# Patient Record
Sex: Male | Born: 1940 | Race: White | Hispanic: No | Marital: Married | Smoking: Former smoker
Health system: Southern US, Community
[De-identification: ages and names within clinical notes are randomized; demographics above are authoritative.]

## PROBLEM LIST (undated history)

## (undated) DIAGNOSIS — Z87442 Personal history of urinary calculi: Secondary | ICD-10-CM

## (undated) DIAGNOSIS — K219 Gastro-esophageal reflux disease without esophagitis: Secondary | ICD-10-CM

## (undated) DIAGNOSIS — M1990 Unspecified osteoarthritis, unspecified site: Secondary | ICD-10-CM

## (undated) DIAGNOSIS — J189 Pneumonia, unspecified organism: Secondary | ICD-10-CM

## (undated) DIAGNOSIS — Z8719 Personal history of other diseases of the digestive system: Secondary | ICD-10-CM

## (undated) DIAGNOSIS — I499 Cardiac arrhythmia, unspecified: Secondary | ICD-10-CM

## (undated) DIAGNOSIS — I10 Essential (primary) hypertension: Secondary | ICD-10-CM

## (undated) HISTORY — PX: KNEE ARTHROPLASTY: SHX992

## (undated) HISTORY — PX: COLONOSCOPY: SHX174

## (undated) HISTORY — PX: TONSILLECTOMY: SUR1361

---

## 2001-12-08 HISTORY — PX: FRACTURE SURGERY: SHX138

## 2017-10-26 ENCOUNTER — Other Ambulatory Visit: Payer: Self-pay | Admitting: Orthopedic Surgery

## 2017-10-26 DIAGNOSIS — M545 Low back pain: Secondary | ICD-10-CM

## 2017-11-10 ENCOUNTER — Ambulatory Visit
Admission: RE | Admit: 2017-11-10 | Discharge: 2017-11-10 | Disposition: A | Discharge status: Home / Self Care | Payer: Medicare Other | Source: Ambulatory Visit | Attending: Orthopedic Surgery | Admitting: Orthopedic Surgery

## 2017-11-10 DIAGNOSIS — M545 Low back pain: Secondary | ICD-10-CM

## 2017-11-10 MED ORDER — ONDANSETRON HCL 4 MG/2ML IJ SOLN
4.0000 mg | Freq: Once | INTRAMUSCULAR | Status: AC
  Administered 2017-11-10: 4 mg via INTRAMUSCULAR

## 2017-11-10 MED ORDER — DIAZEPAM 5 MG PO TABS
5.0000 mg | ORAL_TABLET | Freq: Once | ORAL | Status: AC
  Administered 2017-11-10: 5 mg via ORAL

## 2017-11-10 MED ORDER — IOPAMIDOL (ISOVUE-M 200) INJECTION 41%
15.0000 mL | Freq: Once | INTRAMUSCULAR | Status: AC
  Administered 2017-11-10: 15 mL via INTRATHECAL

## 2017-11-10 MED ORDER — MEPERIDINE HCL 100 MG/ML IJ SOLN
75.0000 mg | Freq: Once | INTRAMUSCULAR | Status: AC
  Administered 2017-11-10: 75 mg via INTRAMUSCULAR

## 2017-11-10 NOTE — Progress Notes (Signed)
Pt states he has not take Eliquis since Saturday.

## 2017-11-10 NOTE — Discharge Instructions (Signed)

## 2017-12-03 NOTE — Progress Notes (Signed)
11-23-17 Cardiac clearance on chart  07-01-17 EKG on chart  04-21-16 ECHO on chart

## 2017-12-03 NOTE — Patient Instructions (Addendum)
Gene Lopez  12/03/2017   Your procedure is scheduled on: 12-16-16   [facility]  Report to Admitting at 6:00 AM   Call this number if you have problems the morning of surgery [phone]   Remember: ONLY 1 PERSON MAY GO WITH YOU TO SHORT STAY TO GET  READY MORNING OF YOUR SURGERY.  Do not eat food or drink liquids :After Midnight.     Take these medicines the morning of surgery with A SIP OF WATER: Metoprolol Succinate (Toprol-XL), Montelukast (Singulair),Cetirizine (Zyrtec)  and Tikosyn (Dofetilide)                                You may not have any metal on your body including hair pins and              piercings  Do not wear jewelry, lotions, powders or deodorant             Men may shave face and neck.   Do not bring valuables to the hospital. [facility] IS NOT             RESPONSIBLE   FOR VALUABLES.  Contacts, dentures or bridgework may not be worn into surgery.  Leave suitcase in the car. After surgery it may be brought to your room.               Please read over the following fact sheets you were given: _____________________________________________________________________          [facility] - Preparing for [facility] Before surgery, you can play an important role.  Because skin is not sterile, your skin needs to be as free of germs as possible.  You can reduce the number of germs on your skin by washing with CHG (chlorahexidine gluconate) soap before surgery.  CHG is an antiseptic cleaner which kills germs and bonds with the skin to continue killing germs even after washing. Please DO NOT use if you have an allergy to CHG or antibacterial soaps.  If your skin becomes reddened/irritated stop using the CHG and inform your nurse when you arrive at Short Stay. Do not shave (including legs and underarms) for at least 48 hours prior to the first CHG shower.  You may shave your face/neck. Please follow these instructions  carefully:  1.  Shower with CHG Soap the night before surgery and the  morning of Surgery.  2.  If you choose to wash your hair, wash your hair first as usual with your  normal  shampoo.  3.  After you shampoo, rinse your hair and body thoroughly to remove the  shampoo.                           4.  Use CHG as you would any other liquid soap.  You can apply chg directly  to the skin and wash                       Gently with a scrungie or clean washcloth.  5.  Apply the CHG Soap to your body ONLY FROM THE NECK DOWN.   Do not use on face/ open  Wound or open sores. Avoid contact with eyes, ears mouth and genitals (private parts).                       Wash face,  Genitals (private parts) with your normal soap.             6.  Wash thoroughly, paying special attention to the area where your surgery  will be performed.  7.  Thoroughly rinse your body with warm water from the neck down.  8.  DO NOT shower/wash with your normal soap after using and rinsing off  the CHG Soap.                9.  Pat yourself dry with a clean towel.            10.  Wear clean pajamas.            11.  Place clean sheets on your bed the night of your first shower and do not  sleep with pets. Day of Surgery : Do not apply any lotions/deodorants the morning of surgery.  Please wear clean clothes to the hospital/surgery center.  FAILURE TO FOLLOW THESE INSTRUCTIONS MAY RESULT IN THE CANCELLATION OF YOUR SURGERY PATIENT SIGNATURE_________________________________  NURSE SIGNATURE__________________________________  ________________________________________________________________________   Gene Lopez  An incentive spirometer is a tool that can help keep your lungs clear and active. This tool measures how well you are filling your lungs with each breath. Taking long deep breaths may help reverse or decrease the chance of developing breathing (pulmonary) problems (especially infection)  following:  A long period of time when you are unable to move or be active. BEFORE THE PROCEDURE   If the spirometer includes an indicator to show your best effort, your nurse or respiratory therapist will set it to a desired goal.  If possible, sit up straight or lean slightly forward. Try not to slouch.  Hold the incentive spirometer in an upright position. INSTRUCTIONS FOR USE  1. Sit on the edge of your bed if possible, or sit up as far as you can in bed or on a chair. 2. Hold the incentive spirometer in an upright position. 3. Breathe out normally. 4. Place the mouthpiece in your mouth and seal your lips tightly around it. 5. Breathe in slowly and as deeply as possible, raising the piston or the ball toward the top of the column. 6. Hold your breath for 3-5 seconds or for as long as possible. Allow the piston or ball to fall to the bottom of the column. 7. Remove the mouthpiece from your mouth and breathe out normally. 8. Rest for a few seconds and repeat Steps 1 through 7 at least 10 times every 1-2 hours when you are awake. Take your time and take a few normal breaths between deep breaths. 9. The spirometer may include an indicator to show your best effort. Use the indicator as a goal to work toward during each repetition. 10. After each set of 10 deep breaths, practice coughing to be sure your lungs are clear. If you have an incision (the cut made at the time of surgery), support your incision when coughing by placing a pillow or rolled up towels firmly against it. Once you are able to get out of bed, walk around indoors and cough well. You may stop using the incentive spirometer when instructed by your caregiver.  RISKS AND COMPLICATIONS  Take your time so you do not get  dizzy or light-headed.  If you are in pain, you may need to take or ask for pain medication before doing incentive spirometry. It is harder to take a deep breath if you are having pain. AFTER USE  Rest and  breathe slowly and easily.  It can be helpful to keep track of a log of your progress. Your caregiver can provide you with a simple table to help with this. If you are using the spirometer at home, follow these instructions: [place] IF:   You are having difficultly using the spirometer.  You have trouble using the spirometer as often as instructed.  Your pain medication is not giving enough relief while using the spirometer.  You develop fever of 100.5 F (38.1 C) or higher. SEEK IMMEDIATE MEDICAL CARE IF:   You cough up bloody sputum that had not been present before.  You develop fever of 102 F (38.9 C) or greater.  You develop worsening pain at or near the incision site. MAKE SURE YOU:   Understand these instructions.  Will watch your condition.  Will get help right away if you are not doing well or get worse. Document Released: 04/06/2007 Document Revised: 02/16/2012 Document Reviewed: 06/07/2007 [facility] Patient Information 2014 [place], [place].   ________________________________________________________________________

## 2017-12-04 ENCOUNTER — Ambulatory Visit (HOSPITAL_COMMUNITY)
Admission: RE | Admit: 2017-12-04 | Discharge: 2017-12-04 | Disposition: A | Discharge status: Home / Self Care | Payer: Medicare Other | Source: Ambulatory Visit | Attending: Surgical | Admitting: Surgical

## 2017-12-04 ENCOUNTER — Encounter (HOSPITAL_COMMUNITY): Payer: Self-pay

## 2017-12-04 ENCOUNTER — Encounter (HOSPITAL_COMMUNITY)
Admission: RE | Admit: 2017-12-04 | Discharge: 2017-12-04 | Disposition: A | Discharge status: Home / Self Care | Payer: Medicare Other | Source: Ambulatory Visit | Attending: Orthopedic Surgery | Admitting: Orthopedic Surgery

## 2017-12-04 ENCOUNTER — Other Ambulatory Visit: Payer: Self-pay

## 2017-12-04 DIAGNOSIS — R918 Other nonspecific abnormal finding of lung field: Secondary | ICD-10-CM | POA: Diagnosis not present

## 2017-12-04 DIAGNOSIS — Z01818 Encounter for other preprocedural examination: Secondary | ICD-10-CM | POA: Diagnosis present

## 2017-12-04 DIAGNOSIS — I517 Cardiomegaly: Secondary | ICD-10-CM | POA: Diagnosis not present

## 2017-12-04 DIAGNOSIS — J9811 Atelectasis: Secondary | ICD-10-CM | POA: Diagnosis not present

## 2017-12-04 DIAGNOSIS — M419 Scoliosis, unspecified: Secondary | ICD-10-CM | POA: Insufficient documentation

## 2017-12-04 DIAGNOSIS — M5136 Other intervertebral disc degeneration, lumbar region: Secondary | ICD-10-CM | POA: Insufficient documentation

## 2017-12-04 DIAGNOSIS — Z01812 Encounter for preprocedural laboratory examination: Secondary | ICD-10-CM | POA: Insufficient documentation

## 2017-12-04 DIAGNOSIS — M545 Low back pain, unspecified: Secondary | ICD-10-CM

## 2017-12-04 DIAGNOSIS — M48061 Spinal stenosis, lumbar region without neurogenic claudication: Secondary | ICD-10-CM | POA: Diagnosis not present

## 2017-12-04 HISTORY — DX: Cardiac arrhythmia, unspecified: I49.9

## 2017-12-04 HISTORY — DX: Personal history of other diseases of the digestive system: Z87.19

## 2017-12-04 HISTORY — DX: Pneumonia, unspecified organism: J18.9

## 2017-12-04 HISTORY — DX: Unspecified osteoarthritis, unspecified site: M19.90

## 2017-12-04 HISTORY — DX: Personal history of urinary calculi: Z87.442

## 2017-12-04 HISTORY — DX: Gastro-esophageal reflux disease without esophagitis: K21.9

## 2017-12-04 HISTORY — DX: Essential (primary) hypertension: I10

## 2017-12-04 LAB — CBC WITH DIFFERENTIAL/PLATELET
Basophils Absolute: 0 10*3/uL (ref 0.0–0.1)
Basophils Relative: 0 %
Eosinophils Absolute: 0.1 10*3/uL (ref 0.0–0.7)
Eosinophils Relative: 2 %
HCT: 36.7 % — ABNORMAL LOW (ref 39.0–52.0)
Hemoglobin: 12.7 g/dL — ABNORMAL LOW (ref 13.0–17.0)
Lymphocytes Relative: 18 %
Lymphs Abs: 1.2 10*3/uL (ref 0.7–4.0)
MCH: 35.4 pg — ABNORMAL HIGH (ref 26.0–34.0)
MCHC: 34.6 g/dL (ref 30.0–36.0)
MCV: 102.2 fL — ABNORMAL HIGH (ref 78.0–100.0)
Monocytes Absolute: 0.6 10*3/uL (ref 0.1–1.0)
Monocytes Relative: 9 %
Neutro Abs: 4.8 10*3/uL (ref 1.7–7.7)
Neutrophils Relative %: 71 %
Platelets: 126 10*3/uL — ABNORMAL LOW (ref 150–400)
RBC: 3.59 MIL/uL — ABNORMAL LOW (ref 4.22–5.81)
RDW: 12.7 % (ref 11.5–15.5)
WBC: 6.7 10*3/uL (ref 4.0–10.5)

## 2017-12-04 LAB — PROTIME-INR
INR: 1.31
Prothrombin Time: 16.2 seconds — ABNORMAL HIGH (ref 11.4–15.2)

## 2017-12-04 LAB — SURGICAL PCR SCREEN
MRSA, PCR: NEGATIVE
STAPHYLOCOCCUS AUREUS: NEGATIVE

## 2017-12-04 LAB — APTT: aPTT: 41 seconds — ABNORMAL HIGH (ref 24–36)

## 2017-12-04 LAB — COMPREHENSIVE METABOLIC PANEL
ALT: 25 U/L (ref 17–63)
AST: 19 U/L (ref 15–41)
Albumin: 3.9 g/dL (ref 3.5–5.0)
Alkaline Phosphatase: 69 U/L (ref 38–126)
Anion gap: 4 — ABNORMAL LOW (ref 5–15)
BUN: 26 mg/dL — ABNORMAL HIGH (ref 6–20)
CO2: 24 mmol/L (ref 22–32)
Calcium: 8.8 mg/dL — ABNORMAL LOW (ref 8.9–10.3)
Chloride: 111 mmol/L (ref 101–111)
Creatinine, Ser: 1.18 mg/dL (ref 0.61–1.24)
GFR calc Af Amer: >60 mL/min (ref >60)
GFR calc non Af Amer: 59 mL/min — ABNORMAL LOW (ref >60)
Glucose, Bld: 92 mg/dL (ref 65–99)
Potassium: 4.8 mmol/L (ref 3.5–5.1)
Sodium: 139 mmol/L (ref 135–145)
Total Bilirubin: 1.1 mg/dL (ref 0.3–1.2)
Total Protein: 7 g/dL (ref 6.5–8.1)

## 2017-12-04 NOTE — Progress Notes (Signed)
12-04-17 CBC w/Diff, CMP, PT, PTT routed to Dr. Darrelyn HillockGioffre for review

## 2017-12-15 NOTE — Anesthesia Preprocedure Evaluation (Addendum)
Anesthesia Evaluation  Patient identified by MRN, date of birth, ID band Patient awake    Reviewed: Allergy & Precautions, NPO status , Patient's Chart, lab work & pertinent test results  Airway Mallampati: II  TM Distance: >3 FB Neck ROM: Full    Dental  (+) Dental Advisory Given   Pulmonary former smoker,    breath sounds clear to auscultation       Cardiovascular hypertension, Pt. on medications and Pt. on home beta blockers + dysrhythmias Atrial Fibrillation  Rhythm:Regular Rate:Normal  2017 Echo: Nl EF. Valves okay. 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   Neuro/Psych Spinal stenosis    GI/Hepatic Neg liver ROS, hiatal hernia, GERD  ,  Endo/Other  negative endocrine ROS  Renal/GU negative Renal ROS     Musculoskeletal  (+) Arthritis ,   Abdominal   Peds  Hematology  (+) anemia ,   Anesthesia Other Findings   Reproductive/Obstetrics                            Lab Results  Component Value Date   WBC 6.7 12/04/2017   HGB 12.7 (L) 12/04/2017   HCT 36.7 (L) 12/04/2017   MCV 102.2 (H) 12/04/2017   PLT 126 (L) 12/04/2017   Lab Results  Component Value Date   CREATININE 1.18 12/04/2017   BUN 26 (H) 12/04/2017   NA 139 12/04/2017   K 4.8 12/04/2017   CL 111 12/04/2017   CO2 24 12/04/2017    Anesthesia Physical Anesthesia Plan  ASA: III  Anesthesia Plan: General   Post-op Pain Management:    Induction: Intravenous  PONV Risk Score and Plan: 2 and Dexamethasone, Ondansetron and Treatment may vary due to age or medical condition  Airway Management Planned: Oral ETT  Additional Equipment:   Intra-op Plan:   Post-operative Plan: Extubation in OR  Informed Consent: I have reviewed the patients History and Physical, chart, labs and discussed the procedure including the risks, benefits and alternatives for the proposed anesthesia with the patient or authorized representative who has indicated  his/her understanding and acceptance.   Dental advisory given  Plan Discussed with: CRNA  Anesthesia Plan Comments:        Anesthesia Quick Evaluation

## 2017-12-16 ENCOUNTER — Encounter (HOSPITAL_COMMUNITY)
Admission: RE | Disposition: A | Discharge status: Home / Self Care | Payer: Self-pay | Source: Ambulatory Visit | Attending: Orthopedic Surgery

## 2017-12-16 ENCOUNTER — Ambulatory Visit (HOSPITAL_COMMUNITY): Payer: Medicare Other | Admitting: Anesthesiology

## 2017-12-16 ENCOUNTER — Ambulatory Visit (HOSPITAL_COMMUNITY): Payer: Medicare Other

## 2017-12-16 ENCOUNTER — Encounter (HOSPITAL_COMMUNITY): Payer: Self-pay | Admitting: *Deleted

## 2017-12-16 ENCOUNTER — Observation Stay (HOSPITAL_COMMUNITY)
Admission: RE | Admit: 2017-12-16 | Discharge: 2017-12-17 | Disposition: A | Discharge status: Home / Self Care | Payer: Medicare Other | Source: Ambulatory Visit | Attending: Orthopedic Surgery | Admitting: Orthopedic Surgery

## 2017-12-16 ENCOUNTER — Other Ambulatory Visit: Payer: Self-pay

## 2017-12-16 DIAGNOSIS — M21371 Foot drop, right foot: Secondary | ICD-10-CM | POA: Insufficient documentation

## 2017-12-16 DIAGNOSIS — M48062 Spinal stenosis, lumbar region with neurogenic claudication: Secondary | ICD-10-CM | POA: Diagnosis not present

## 2017-12-16 DIAGNOSIS — Z79899 Other long term (current) drug therapy: Secondary | ICD-10-CM | POA: Insufficient documentation

## 2017-12-16 DIAGNOSIS — Z7901 Long term (current) use of anticoagulants: Secondary | ICD-10-CM | POA: Insufficient documentation

## 2017-12-16 DIAGNOSIS — Z87891 Personal history of nicotine dependence: Secondary | ICD-10-CM | POA: Insufficient documentation

## 2017-12-16 DIAGNOSIS — I4891 Unspecified atrial fibrillation: Secondary | ICD-10-CM | POA: Insufficient documentation

## 2017-12-16 DIAGNOSIS — M5126 Other intervertebral disc displacement, lumbar region: Secondary | ICD-10-CM | POA: Diagnosis not present

## 2017-12-16 DIAGNOSIS — M549 Dorsalgia, unspecified: Secondary | ICD-10-CM | POA: Diagnosis present

## 2017-12-16 DIAGNOSIS — Z419 Encounter for procedure for purposes other than remedying health state, unspecified: Secondary | ICD-10-CM

## 2017-12-16 DIAGNOSIS — I10 Essential (primary) hypertension: Secondary | ICD-10-CM | POA: Diagnosis not present

## 2017-12-16 DIAGNOSIS — Z96653 Presence of artificial knee joint, bilateral: Secondary | ICD-10-CM | POA: Insufficient documentation

## 2017-12-16 HISTORY — PX: LUMBAR LAMINECTOMY/DECOMPRESSION MICRODISCECTOMY: SHX5026

## 2017-12-16 LAB — HEMOGLOBIN AND HEMATOCRIT, BLOOD
HCT: 29.9 % — ABNORMAL LOW (ref 39.0–52.0)
Hemoglobin: 10.5 g/dL — ABNORMAL LOW (ref 13.0–17.0)

## 2017-12-16 SURGERY — LUMBAR LAMINECTOMY/DECOMPRESSION MICRODISCECTOMY
Anesthesia: General | Site: Back

## 2017-12-16 MED ORDER — HYDROCODONE-ACETAMINOPHEN 10-325 MG PO TABS
2.0000 | ORAL_TABLET | ORAL | Status: DC | PRN
  Administered 2017-12-16 – 2017-12-17 (×3): 2 via ORAL
  Filled 2017-12-16 (×3): qty 2

## 2017-12-16 MED ORDER — BUPIVACAINE LIPOSOME 1.3 % IJ SUSP
INTRAMUSCULAR | Status: DC | PRN
  Administered 2017-12-16: 20 mL

## 2017-12-16 MED ORDER — FENTANYL CITRATE (PF) 100 MCG/2ML IJ SOLN
INTRAMUSCULAR | Status: DC | PRN
  Administered 2017-12-16 (×3): 50 ug via INTRAVENOUS
  Administered 2017-12-16: 100 ug via INTRAVENOUS
  Administered 2017-12-16: 50 ug via INTRAVENOUS

## 2017-12-16 MED ORDER — ACETAMINOPHEN 650 MG RE SUPP: 650.0000 mg | RECTAL | Status: DC | PRN

## 2017-12-16 MED ORDER — PHENYLEPHRINE 40 MCG/ML (10ML) SYRINGE FOR IV PUSH (FOR BLOOD PRESSURE SUPPORT)
PREFILLED_SYRINGE | INTRAVENOUS | Status: AC
  Filled 2017-12-16: qty 10

## 2017-12-16 MED ORDER — BUPIVACAINE LIPOSOME 1.3 % IJ SUSP
20.0000 mL | Freq: Once | INTRAMUSCULAR | Status: DC
  Filled 2017-12-16: qty 20

## 2017-12-16 MED ORDER — ROCURONIUM BROMIDE 50 MG/5ML IV SOSY
PREFILLED_SYRINGE | INTRAVENOUS | Status: AC
  Filled 2017-12-16: qty 5

## 2017-12-16 MED ORDER — CHLORHEXIDINE GLUCONATE 4 % EX LIQD: 60.0000 mL | Freq: Once | CUTANEOUS | Status: DC

## 2017-12-16 MED ORDER — FENTANYL CITRATE (PF) 250 MCG/5ML IJ SOLN
INTRAMUSCULAR | Status: AC
  Filled 2017-12-16: qty 5

## 2017-12-16 MED ORDER — LIDOCAINE HCL (CARDIAC) 20 MG/ML IV SOLN
INTRAVENOUS | Status: DC | PRN
  Administered 2017-12-16: 75 mg via INTRAVENOUS
  Administered 2017-12-16: 25 mg via INTRATRACHEAL

## 2017-12-16 MED ORDER — PHENOL 1.4 % MT LIQD: 1.0000 | OROMUCOSAL | Status: DC | PRN

## 2017-12-16 MED ORDER — FENTANYL CITRATE (PF) 100 MCG/2ML IJ SOLN
INTRAMUSCULAR | Status: AC
  Filled 2017-12-16: qty 2

## 2017-12-16 MED ORDER — BACITRACIN-NEOMYCIN-POLYMYXIN 400-5-5000 EX OINT
TOPICAL_OINTMENT | CUTANEOUS | Status: DC | PRN
  Administered 2017-12-16: 1 via TOPICAL

## 2017-12-16 MED ORDER — LIDOCAINE-EPINEPHRINE (PF) 1 %-1:200000 IJ SOLN
INTRAMUSCULAR | Status: AC
  Filled 2017-12-16: qty 30

## 2017-12-16 MED ORDER — ONDANSETRON HCL 4 MG/2ML IJ SOLN: 4.0000 mg | Freq: Four times a day (QID) | INTRAMUSCULAR | Status: DC | PRN

## 2017-12-16 MED ORDER — SUGAMMADEX SODIUM 200 MG/2ML IV SOLN
INTRAVENOUS | Status: AC
  Filled 2017-12-16: qty 2

## 2017-12-16 MED ORDER — FLEET ENEMA 7-19 GM/118ML RE ENEM: 1.0000 | ENEMA | Freq: Once | RECTAL | Status: DC | PRN

## 2017-12-16 MED ORDER — PHENYLEPHRINE HCL 10 MG/ML IJ SOLN
INTRAMUSCULAR | Status: AC
  Filled 2017-12-16: qty 1

## 2017-12-16 MED ORDER — LIDOCAINE 2% (20 MG/ML) 5 ML SYRINGE
INTRAMUSCULAR | Status: AC
  Filled 2017-12-16: qty 5

## 2017-12-16 MED ORDER — HYDROMORPHONE HCL 1 MG/ML IJ SOLN
INTRAMUSCULAR | Status: AC
  Filled 2017-12-16: qty 1

## 2017-12-16 MED ORDER — LACTATED RINGERS IV SOLN
INTRAVENOUS | Status: DC
  Administered 2017-12-16: 100 mL/h via INTRAVENOUS

## 2017-12-16 MED ORDER — POLYETHYLENE GLYCOL 3350 17 G PO PACK: 17.0000 g | PACK | Freq: Every day | ORAL | Status: DC | PRN

## 2017-12-16 MED ORDER — ONDANSETRON HCL 4 MG PO TABS: 4.0000 mg | ORAL_TABLET | Freq: Four times a day (QID) | ORAL | Status: DC | PRN

## 2017-12-16 MED ORDER — SUCCINYLCHOLINE CHLORIDE 200 MG/10ML IV SOSY
PREFILLED_SYRINGE | INTRAVENOUS | Status: AC
  Filled 2017-12-16: qty 10

## 2017-12-16 MED ORDER — LISINOPRIL 10 MG PO TABS
10.0000 mg | ORAL_TABLET | Freq: Every day | ORAL | Status: DC
  Administered 2017-12-17: 10 mg via ORAL
  Filled 2017-12-16: qty 1

## 2017-12-16 MED ORDER — THROMBIN 5000 UNITS EX SOLR
CUTANEOUS | Status: DC | PRN
  Administered 2017-12-16: 20000 [IU] via TOPICAL

## 2017-12-16 MED ORDER — ACETAMINOPHEN 325 MG PO TABS: 650.0000 mg | ORAL_TABLET | ORAL | Status: DC | PRN

## 2017-12-16 MED ORDER — MENTHOL 3 MG MT LOZG: 1.0000 | LOZENGE | OROMUCOSAL | Status: DC | PRN

## 2017-12-16 MED ORDER — ONDANSETRON HCL 4 MG/2ML IJ SOLN
INTRAMUSCULAR | Status: AC
  Filled 2017-12-16: qty 2

## 2017-12-16 MED ORDER — HYDROCODONE-ACETAMINOPHEN 5-325 MG PO TABS: 1.0000 | ORAL_TABLET | ORAL | Status: DC | PRN

## 2017-12-16 MED ORDER — PROMETHAZINE HCL 25 MG/ML IJ SOLN: 6.2500 mg | INTRAMUSCULAR | Status: DC | PRN

## 2017-12-16 MED ORDER — BUPIVACAINE-EPINEPHRINE 0.5% -1:200000 IJ SOLN
INTRAMUSCULAR | Status: DC | PRN
  Administered 2017-12-16: 20 mL

## 2017-12-16 MED ORDER — THROMBIN (RECOMBINANT) 5000 UNITS EX SOLR
CUTANEOUS | Status: AC
  Filled 2017-12-16: qty 5000

## 2017-12-16 MED ORDER — PHENYLEPHRINE HCL 10 MG/ML IJ SOLN
INTRAMUSCULAR | Status: DC | PRN
  Administered 2017-12-16: 160 ug via INTRAVENOUS
  Administered 2017-12-16 (×2): 80 ug via INTRAVENOUS

## 2017-12-16 MED ORDER — SUCCINYLCHOLINE CHLORIDE 20 MG/ML IJ SOLN
INTRAMUSCULAR | Status: DC | PRN
  Administered 2017-12-16: 120 mg via INTRAVENOUS

## 2017-12-16 MED ORDER — PROPOFOL 10 MG/ML IV BOLUS
INTRAVENOUS | Status: AC
  Filled 2017-12-16: qty 20

## 2017-12-16 MED ORDER — LIDOCAINE-EPINEPHRINE 0.5 %-1:200000 IJ SOLN
INTRAMUSCULAR | Status: AC
  Filled 2017-12-16: qty 1

## 2017-12-16 MED ORDER — METHOCARBAMOL 1000 MG/10ML IJ SOLN
500.0000 mg | Freq: Four times a day (QID) | INTRAVENOUS | Status: DC | PRN
  Administered 2017-12-16: 500 mg via INTRAVENOUS
  Filled 2017-12-16: qty 550

## 2017-12-16 MED ORDER — SODIUM CHLORIDE 0.9 % IV SOLN
INTRAVENOUS | Status: AC
  Filled 2017-12-16: qty 500000

## 2017-12-16 MED ORDER — METOPROLOL SUCCINATE 12.5 MG HALF TABLET
12.5000 mg | ORAL_TABLET | Freq: Every day | ORAL | Status: DC
  Administered 2017-12-17: 12.5 mg via ORAL
  Filled 2017-12-16: qty 1

## 2017-12-16 MED ORDER — HYDROMORPHONE HCL 1 MG/ML IJ SOLN
0.2500 mg | INTRAMUSCULAR | Status: DC | PRN
  Administered 2017-12-16: 0.25 mg via INTRAVENOUS

## 2017-12-16 MED ORDER — BISACODYL 5 MG PO TBEC: 5.0000 mg | DELAYED_RELEASE_TABLET | Freq: Every day | ORAL | Status: DC | PRN

## 2017-12-16 MED ORDER — MONTELUKAST SODIUM 10 MG PO TABS
10.0000 mg | ORAL_TABLET | Freq: Every day | ORAL | Status: DC
  Administered 2017-12-17: 10 mg via ORAL
  Filled 2017-12-16: qty 1

## 2017-12-16 MED ORDER — SUGAMMADEX SODIUM 200 MG/2ML IV SOLN
INTRAVENOUS | Status: DC | PRN
  Administered 2017-12-16: 200 mg via INTRAVENOUS

## 2017-12-16 MED ORDER — HYDROMORPHONE HCL 1 MG/ML IJ SOLN: 0.5000 mg | INTRAMUSCULAR | Status: DC | PRN

## 2017-12-16 MED ORDER — CEFAZOLIN SODIUM-DEXTROSE 2-4 GM/100ML-% IV SOLN
2.0000 g | INTRAVENOUS | Status: AC
  Administered 2017-12-16: 2 g via INTRAVENOUS
  Filled 2017-12-16: qty 100

## 2017-12-16 MED ORDER — METHOCARBAMOL 500 MG PO TABS
500.0000 mg | ORAL_TABLET | Freq: Four times a day (QID) | ORAL | Status: DC | PRN
  Administered 2017-12-17: 500 mg via ORAL
  Filled 2017-12-16: qty 1

## 2017-12-16 MED ORDER — DEXAMETHASONE SODIUM PHOSPHATE 10 MG/ML IJ SOLN
INTRAMUSCULAR | Status: AC
  Filled 2017-12-16: qty 1

## 2017-12-16 MED ORDER — THROMBIN (RECOMBINANT) 5000 UNITS EX SOLR
CUTANEOUS | Status: AC
  Filled 2017-12-16: qty 10000

## 2017-12-16 MED ORDER — BUPIVACAINE-EPINEPHRINE 0.5% -1:200000 IJ SOLN
INTRAMUSCULAR | Status: AC
  Filled 2017-12-16: qty 1

## 2017-12-16 MED ORDER — ONDANSETRON HCL 4 MG/2ML IJ SOLN
INTRAMUSCULAR | Status: DC | PRN
  Administered 2017-12-16: 4 mg via INTRAVENOUS

## 2017-12-16 MED ORDER — CEFAZOLIN SODIUM-DEXTROSE 1-4 GM/50ML-% IV SOLN
1.0000 g | Freq: Three times a day (TID) | INTRAVENOUS | Status: DC
  Administered 2017-12-16 – 2017-12-17 (×2): 1 g via INTRAVENOUS
  Filled 2017-12-16 (×2): qty 50

## 2017-12-16 MED ORDER — PHENYLEPHRINE HCL 10 MG/ML IJ SOLN
INTRAVENOUS | Status: DC | PRN
  Administered 2017-12-16: 30 ug/min via INTRAVENOUS

## 2017-12-16 MED ORDER — BACITRACIN-NEOMYCIN-POLYMYXIN 400-5-5000 EX OINT
TOPICAL_OINTMENT | CUTANEOUS | Status: AC
  Filled 2017-12-16: qty 1

## 2017-12-16 MED ORDER — ROCURONIUM BROMIDE 100 MG/10ML IV SOLN
INTRAVENOUS | Status: DC | PRN
  Administered 2017-12-16: 30 mg via INTRAVENOUS
  Administered 2017-12-16: 10 mg via INTRAVENOUS
  Administered 2017-12-16: 20 mg via INTRAVENOUS
  Administered 2017-12-16: 50 mg via INTRAVENOUS

## 2017-12-16 MED ORDER — SODIUM CHLORIDE 0.9 % IV SOLN
INTRAVENOUS | Status: DC | PRN
  Administered 2017-12-16: 500 mL

## 2017-12-16 MED ORDER — DEXAMETHASONE SODIUM PHOSPHATE 10 MG/ML IJ SOLN
INTRAMUSCULAR | Status: DC | PRN
  Administered 2017-12-16: 10 mg via INTRAVENOUS

## 2017-12-16 MED ORDER — PROPOFOL 10 MG/ML IV BOLUS
INTRAVENOUS | Status: DC | PRN
  Administered 2017-12-16: 30 mg via INTRAVENOUS
  Administered 2017-12-16: 170 mg via INTRAVENOUS

## 2017-12-16 MED ORDER — LACTATED RINGERS IV SOLN
INTRAVENOUS | Status: DC
  Administered 2017-12-16 (×2): via INTRAVENOUS

## 2017-12-16 MED ORDER — DOFETILIDE 250 MCG PO CAPS
500.0000 ug | ORAL_CAPSULE | Freq: Two times a day (BID) | ORAL | Status: DC
  Administered 2017-12-16 – 2017-12-17 (×2): 500 ug via ORAL
  Filled 2017-12-16 (×2): qty 2

## 2017-12-16 SURGICAL SUPPLY — 47 items
BAG ZIPLOCK 12X15 (MISCELLANEOUS) IMPLANT
BENZOIN TINCTURE PRP APPL 2/3 (GAUZE/BANDAGES/DRESSINGS) ×2 IMPLANT
CLEANER TIP ELECTROSURG 2X2 (MISCELLANEOUS) ×2 IMPLANT
COVER SURGICAL LIGHT HANDLE (MISCELLANEOUS) ×2 IMPLANT
DRAIN PENROSE 18X1/4 LTX STRL (WOUND CARE) IMPLANT
DRAPE MICROSCOPE LEICA (MISCELLANEOUS) ×2 IMPLANT
DRAPE POUCH INSTRU U-SHP 10X18 (DRAPES) ×2 IMPLANT
DRAPE SHEET LG 3/4 BI-LAMINATE (DRAPES) ×4 IMPLANT
DRAPE SURG 17X11 SM STRL (DRAPES) ×2 IMPLANT
DRSG ADAPTIC 3X8 NADH LF (GAUZE/BANDAGES/DRESSINGS) ×2 IMPLANT
DRSG PAD ABDOMINAL 8X10 ST (GAUZE/BANDAGES/DRESSINGS) ×8 IMPLANT
DURAPREP 26ML APPLICATOR (WOUND CARE) ×2 IMPLANT
ELECT BLADE TIP CTD 4 INCH (ELECTRODE) ×2 IMPLANT
ELECT REM PT RETURN 15FT ADLT (MISCELLANEOUS) ×2 IMPLANT
GAUZE SPONGE 4X4 12PLY STRL (GAUZE/BANDAGES/DRESSINGS) ×2 IMPLANT
GLOVE BIOGEL PI IND STRL 6.5 (GLOVE) ×1 IMPLANT
GLOVE BIOGEL PI IND STRL 8.5 (GLOVE) ×1 IMPLANT
GLOVE BIOGEL PI INDICATOR 6.5 (GLOVE) ×1
GLOVE BIOGEL PI INDICATOR 8.5 (GLOVE) ×1
GLOVE ECLIPSE 8.0 STRL XLNG CF (GLOVE) ×4 IMPLANT
GLOVE SURG SS PI 6.5 STRL IVOR (GLOVE) ×2 IMPLANT
GOWN STRL REUS W/ TWL LRG LVL3 (GOWN DISPOSABLE) ×1 IMPLANT
GOWN STRL REUS W/TWL LRG LVL3 (GOWN DISPOSABLE) ×1
GOWN STRL REUS W/TWL XL LVL3 (GOWN DISPOSABLE) ×4 IMPLANT
HEMOSTAT SPONGE AVITENE ULTRA (HEMOSTASIS) ×2 IMPLANT
KIT BASIN OR (CUSTOM PROCEDURE TRAY) ×2 IMPLANT
KIT POSITIONING SURG ANDREWS (MISCELLANEOUS) ×2 IMPLANT
MANIFOLD NEPTUNE II (INSTRUMENTS) ×2 IMPLANT
MARKER SKIN DUAL TIP RULER LAB (MISCELLANEOUS) ×2 IMPLANT
NEEDLE HYPO 22GX1.5 SAFETY (NEEDLE) ×4 IMPLANT
NEEDLE SPNL 18GX3.5 QUINCKE PK (NEEDLE) ×4 IMPLANT
PACK LAMINECTOMY ORTHO (CUSTOM PROCEDURE TRAY) ×2 IMPLANT
PAD ABD 8X10 STRL (GAUZE/BANDAGES/DRESSINGS) ×6 IMPLANT
PATTIES SURGICAL .5 X.5 (GAUZE/BANDAGES/DRESSINGS) ×2 IMPLANT
PATTIES SURGICAL .75X.75 (GAUZE/BANDAGES/DRESSINGS) ×4 IMPLANT
PATTIES SURGICAL 1X1 (DISPOSABLE) IMPLANT
PIN SAFETY NICK PLATE  2 MED (MISCELLANEOUS)
PIN SAFETY NICK PLATE 2 MED (MISCELLANEOUS) IMPLANT
SPONGE LAP 4X18 X RAY DECT (DISPOSABLE) ×8 IMPLANT
SPONGE SURGIFOAM ABS GEL 100 (HEMOSTASIS) ×2 IMPLANT
STAPLER VISISTAT 35W (STAPLE) ×2 IMPLANT
SUT VIC AB 1 CT1 27 (SUTURE) ×2
SUT VIC AB 1 CT1 27XBRD ANTBC (SUTURE) ×2 IMPLANT
SUT VIC AB 2-0 CT1 27 (SUTURE) ×2
SUT VIC AB 2-0 CT1 TAPERPNT 27 (SUTURE) ×2 IMPLANT
SYR 20CC LL (SYRINGE) ×4 IMPLANT
TOWEL OR 17X26 10 PK STRL BLUE (TOWEL DISPOSABLE) ×2 IMPLANT

## 2017-12-16 NOTE — Interval H&P Note (Signed)
History and Physical Interval Note:  12/16/2017 9:02 AM  Gene Lopez  has presented today for surgery, with the diagnosis of Spinal stenosis L3-L4, L4-L5  The various methods of treatment have been discussed with the patient and family. After consideration of risks, benefits and other options for treatment, the patient has consented to  Procedure(s): Central decompression lumbar laminectomy L3-L4, L4-L5, microdisectomy (N/A) as a surgical intervention .  The patient's history has been reviewed, patient examined, no change in status, stable for surgery.  I have reviewed the patient's chart and labs.  Questions were answered to the patient's satisfaction.     Ranee Gosselinonald [name]

## 2017-12-16 NOTE — Evaluation (Signed)
Physical Therapy Evaluation Patient Details Name: Gene AbrahamWilliam Graber MRN: [id number] DOB: 1941/09/11 Today's Date: 12/16/2017   History of Present Illness  Pt s/p L3-4, L 4-5 Decompressive lumbar laminectomy  Clinical Impression  Pt s/p back surgery and presents with functional mobility limitations 2* post op pain and back precautions.  Pt should progress to dc home with family assist.    Follow Up Recommendations No PT follow up    Equipment Recommendations  None recommended by PT    Recommendations for Other Services       Precautions / Restrictions Precautions Precautions: Back Restrictions Weight Bearing Restrictions: No      Mobility  Bed Mobility Overal bed mobility: Needs Assistance Bed Mobility: Supine to Sit     Supine to sit: Min assist     General bed mobility comments: cues for correct log roll technique and transition to sitting  Transfers Overall transfer level: Needs assistance   Transfers: Sit to/from Stand Sit to Stand: Min guard         General transfer comment: cues for transition position, use of UEs to self assist and adherence to back precautions  Ambulation/Gait Ambulation/Gait assistance: Min assist Ambulation Distance (Feet): 400 Feet Assistive device: Rolling walker (2 wheeled) Gait Pattern/deviations: Step-through pattern;Decreased step length - right;Decreased step length - left;Shuffle;Trunk flexed Gait velocity: decr Gait velocity interpretation: Below normal speed for age/gender General Gait Details: cues for posture and position from RW  Stairs            Wheelchair Mobility    Modified Rankin (Stroke Patients Only)       Balance                                             Pertinent Vitals/Pain Pain Assessment: 0-10 Pain Score: 3  Pain Location: back Pain Descriptors / Indicators: Sore Pain Intervention(s): Limited activity within patient's tolerance;Monitored during session;Premedicated  before session    Home Living Family/patient expects to be discharged to:: Private residence Living Arrangements: Spouse/significant other Available Help at Discharge: Family Type of Home: House Home Access: Stairs to enter Entrance Stairs-Rails: Right Entrance Stairs-Number of Steps: 3 Home Layout: One level Home Equipment: Environmental consultantWalker - 2 wheels      Prior Function Level of Independence: Independent               Hand Dominance        Extremity/Trunk Assessment   Upper Extremity Assessment Upper Extremity Assessment: Overall WFL for tasks assessed    Lower Extremity Assessment Lower Extremity Assessment: Overall WFL for tasks assessed       Communication   Communication: HOH  Cognition Arousal/Alertness: Awake/alert Behavior During Therapy: WFL for tasks assessed/performed Overall Cognitive Status: Within Functional Limits for tasks assessed                                        General Comments      Exercises     Assessment/Plan    PT Assessment Patient needs continued PT services  PT Problem List Decreased activity tolerance;Decreased mobility;Decreased knowledge of use of DME;Decreased knowledge of precautions;Pain       PT Treatment Interventions DME instruction;Gait training;Stair training;Functional mobility training;Therapeutic activities;Therapeutic exercise;Patient/family education    PT Goals (Current goals can be found in the  Care Plan section)  Acute Rehab PT Goals Patient Stated Goal: Regain IND  PT Goal Formulation: With patient Time For Goal Achievement: 12/19/17 Potential to Achieve Goals: Good    Frequency 7X/week   Barriers to discharge        Co-evaluation               AM-PAC PT "6 Clicks" Daily Activity  Outcome Measure Difficulty turning over in bed (including adjusting bedclothes, sheets and blankets)?: A Lot Difficulty moving from lying on back to sitting on the side of the bed? : A  Lot Difficulty sitting down on and standing up from a chair with arms (e.g., wheelchair, bedside commode, etc,.)?: A Lot Help needed moving to and from a bed to chair (including a wheelchair)?: A Little Help needed walking in hospital room?: A Little Help needed climbing 3-5 steps with a railing? : A Little 6 Click Score: 15    End of Session   Activity Tolerance: Patient tolerated treatment well Patient left: in chair;with call bell/phone within reach;with family/visitor present Nurse Communication: Mobility status PT Visit Diagnosis: Difficulty in walking, not elsewhere classified (R26.2)    Time: 4098-1191 PT Time Calculation (min) (ACUTE ONLY): 19 min   Charges:   PT Evaluation $PT Eval Low Complexity: 1 Low     PT G Codes:        Pg [phone]   Ellice Boultinghouse 12/16/2017, 5:28 PM

## 2017-12-16 NOTE — H&P (Signed)
Duriel Deery is an 77 y.o. male.   Chief Complaint: lumbar spine pain HPI: The patient is a 77 year old male who presented with the chief complaint of low back pain x several weeks. He did not recall any specific injury. He then began to develop progressively worsening pain in the right thigh. He did not see relief of his symptoms with any conservative measures, including prednisone, NSAIDs, activity modification, and injection. CT myelogram showed a disc herniation at L3-L4 and spinal stenosis at L4-L5 on the right which is causing a foot drop on the right side.   Past Medical History:  Diagnosis Date  . Arthritis    Hands, Knees  . Dysrhythmia   . GERD (gastroesophageal reflux disease)   . History of hiatal hernia    2008  . History of kidney stones   . Hypertension   . Pneumonia     Past Surgical History:  Procedure Laterality Date  . COLONOSCOPY    . FRACTURE SURGERY  2003   Wrist  . KNEE ARTHROPLASTY Bilateral    L full arthroplast, R partial knee replacement  . TONSILLECTOMY      History reviewed. No pertinent family history. Social History:  reports that he quit smoking about 35 years ago. he has never used smokeless tobacco. He reports that he drinks about 1.8 oz of alcohol per week. He reports that he does not use drugs.  Allergies:  Allergies  Allergen Reactions  . Other     Pet dander  . Versed [Midazolam] Other (See Comments)    Causes "severe memory loss for days"  Refuses versed    Medications Prior to Admission  Medication Sig Dispense Refill  . acetaminophen (TYLENOL) 650 MG CR tablet Take 1,300 mg by mouth 2 (two) times daily.    Marland Kitchen apixaban (ELIQUIS) 5 MG TABS tablet Take 5 mg 2 (two) times daily by mouth.    . Ascorbic Acid (VITAMIN C) 1000 MG tablet Take 1,000 mg by mouth daily.    Marland Kitchen atorvastatin (LIPITOR) 10 MG tablet Take 10 mg by mouth at bedtime.     . baclofen (LIORESAL) 20 MG tablet Take 10 mg by mouth 2 (two) times daily as needed for muscle  spasms.    . cetirizine (ZYRTEC) 10 MG tablet Take 10 mg daily by mouth.    . dofetilide (TIKOSYN) 500 MCG capsule Take 500 mcg by mouth every 12 (twelve) hours.     Marland Kitchen lisinopril (PRINIVIL,ZESTRIL) 10 MG tablet Take 10 mg by mouth daily.    . metoprolol succinate (TOPROL-XL) 25 MG 24 hr tablet Take 12.5 mg by mouth daily.     . montelukast (SINGULAIR) 10 MG tablet Take 10 mg by mouth daily.     Marland Kitchen tiZANidine (ZANAFLEX) 4 MG tablet Take 2 mg by mouth 2 (two) times daily as needed for muscle spasms.    . Azelastine-Fluticasone (DYMISTA) 137-50 MCG/ACT SUSP Place 1-2 sprays into the nose daily as needed (for nasal drainage.).    Marland Kitchen tadalafil (CIALIS) 20 MG tablet Take 20 mg by mouth daily as needed for erectile dysfunction.      Review of Systems  Constitutional: Negative.   HENT: Negative.   Eyes: Negative.   Cardiovascular: Negative.   Gastrointestinal: Negative.   Genitourinary: Negative.   Musculoskeletal: Positive for back pain and myalgias. Negative for falls, joint pain and neck pain.  Skin: Negative.   Neurological: Positive for tingling and focal weakness. Negative for dizziness, tremors, sensory change, speech change,  seizures, loss of consciousness and headaches.  Endo/Heme/Allergies: Negative.   Psychiatric/Behavioral: Negative.     Blood pressure 137/81, pulse 76, temperature 98.5 F (36.9 C), temperature source Oral, resp. rate 18, height 5\' 10"  (1.778 m), weight 98.4 kg (217 lb), SpO2 95 %. Physical Exam  Constitutional: He is oriented to person, place, and time. He appears well-nourished. No distress.  Overweight  HENT:  Head: Normocephalic and atraumatic.  Right Ear: External ear normal.  Left Ear: External ear normal.  Nose: Nose normal.  Mouth/Throat: Oropharynx is clear and moist.  Eyes: Conjunctivae and EOM are normal.  Neck: Normal range of motion. Neck supple.  Cardiovascular: Normal rate, regular rhythm, normal heart sounds and intact distal pulses.  No  murmur heard. Respiratory: Effort normal and breath sounds normal. No respiratory distress. He has no wheezes.  GI: Soft. Bowel sounds are normal. He exhibits no distension. There is no tenderness.  Musculoskeletal:       Right hip: Normal.       Left hip: Normal.       Right knee: Normal.       Left knee: Normal.       Right ankle: He exhibits decreased range of motion.       Left ankle: Normal.       Lumbar back: He exhibits decreased range of motion, tenderness, pain and spasm. He exhibits no bony tenderness.  Neurological: He is alert and oriented to person, place, and time. No sensory deficit.  2/5 weakness right foot dorsiflexors  Skin: No rash noted. He is not diaphoretic. No erythema.  Psychiatric: He has a normal mood and affect. His behavior is normal.     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 He needs a central decompression lumbar laminectomy with microdiscectomy. Dr. Darrelyn HillockGioffre did discuss surgical intervention with the patient and the patient elected to move forward with the procedure. Risks and benefits of the procedure were discussed.   H&P performed by Dr. Ranee Gosselinonald Gioffre, MD Documented by Dimitri PedAmber Raequan Vanschaick, PA-C   Aalaysia Liggins Leotis ShamesLAUREN, PA-C 12/16/2017, 8:21 AM

## 2017-12-16 NOTE — Anesthesia Procedure Notes (Signed)
Procedure Name: Intubation Date/Time: 12/16/2017 9:14 AM Performed by: Lissa Morales, CRNA Pre-anesthesia Checklist: Patient identified, Emergency Drugs available, Suction available and Patient being monitored Patient Re-evaluated:Patient Re-evaluated prior to induction Oxygen Delivery Method: Circle system utilized Preoxygenation: Pre-oxygenation with 100% oxygen Induction Type: IV induction Ventilation: Mask ventilation without difficulty Laryngoscope Size: Mac, Glidescope and 4 Grade View: Grade III Tube type: Oral Tube size: 8.0 mm Number of attempts: 1 Airway Equipment and Method: Stylet and Oral airway Placement Confirmation: ETT inserted through vocal cords under direct vision,  positive ETCO2 and breath sounds checked- equal and bilateral Secured at: 24 cm Tube secured with: Tape Dental Injury: Teeth and Oropharynx as per pre-operative assessment  Difficulty Due To: Difficulty was anticipated, Difficult Airway- due to large tongue, Difficult Airway- due to reduced neck mobility, Difficult Airway- due to anterior larynx and Difficult Airway- due to dentition Comments: Elective glidesope small mouth with numerous caps,large tongue,  Decreased neck mobility from 3 level neck fusion

## 2017-12-16 NOTE — Anesthesia Postprocedure Evaluation (Signed)
Anesthesia Post Note  Patient: Gene Lopez  Procedure(s) Performed: Central decompression lumbar laminectomy L3-L4, L4-L5, microdisectomy (N/A Back)     Patient location during evaluation: PACU Anesthesia Type: General Level of consciousness: awake and alert Pain management: pain level controlled Vital Signs Assessment: post-procedure vital signs reviewed and stable Respiratory status: spontaneous breathing, nonlabored ventilation, respiratory function stable and patient connected to nasal cannula oxygen Cardiovascular status: blood pressure returned to baseline and stable Postop Assessment: no apparent nausea or vomiting Anesthetic complications: no    Last Vitals:  Vitals:   12/16/17 1400 12/16/17 1427  BP: 139/79 140/70  Pulse: 67 69  Resp: 14 15  Temp: 36.9 C 36.8 C  SpO2: 98% 97%    Last Pain:  Vitals:   12/16/17 1427  TempSrc: Oral  PainSc: 4                  Kennieth RadFitzgerald, Lilou Kneip E

## 2017-12-16 NOTE — Brief Op Note (Signed)
12/16/2017  12:17 PM  PATIENT:  Gene AbrahamWilliam Lopez  77 y.o. 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 Partial Foot Drop on the Right. Foraminal Stenosis involving the L-4 and L-5 nerve Roots on the Right.  POST-OPERATIVE:Same as Pre-Op  PROCEDURE:  Procedure(s): Central decompression lumbar laminectomy L3-L4, L4-L5, microdisectomy (N/A),for SEVERE Spinal Stenosis..Microdiscectomy at L-3-L-4 on the right.Foraminotomies for the L-4and L-5 Nerve roots on the right.  SURGEON:  Surgeon(s) and Role:    * Ranee GosselinGioffre, Kienan Doublin, MD - Primary  PHYSICIAN ASSISTANT: Dimitri PedAmber Constable PA  ASSISTANTS: Dimitri PedAmber Constable PA  ANESTHESIA:   general  EBL:  300 mL   BLOOD ADMINISTERED:none  DRAINS: none   LOCAL MEDICATIONS USED:  MARCAINE 20cc of 0.50%with Epinephrine at start of the case and 20cc of Exparel at the end of the case    SPECIMEN:  Source of Specimen:  L-3-L-4  DISPOSITION OF SPECIMEN:  PATHOLOGY  COUNTS:  YES  TOURNIQUET:  * No tourniquets in log *  DICTATION: .Other Dictation: Dictation Number  [id number]  PLAN OF CARE: Admit for overnight observation  PATIENT DISPOSITION:  PACU - hemodynamically stable.   Delay start of Pharmacological VTE agent (>24hrs) due to surgical blood loss or risk of bleeding: yes

## 2017-12-16 NOTE — Transfer of Care (Signed)
Immediate Anesthesia Transfer of Care Note  Patient: Gene AbrahamWilliam Lopez  Procedure(s) Performed: Central decompression lumbar laminectomy L3-L4, L4-L5, microdisectomy (N/A Back)  Patient Location: PACU  Anesthesia Type:General  Level of Consciousness: awake, alert , oriented and patient cooperative  Airway & Oxygen Therapy: Patient Spontanous Breathing and Patient connected to face mask oxygen  Post-op Assessment: Report given to RN, Post -op Vital signs reviewed and stable and Patient moving all extremities X 4  Post vital signs: stable  Last Vitals:  Vitals:   12/16/17 0623  BP: 137/81  Pulse: 76  Resp: 18  Temp: 36.9 C  SpO2: 95%    Last Pain:  Vitals:   12/16/17 0638  TempSrc:   PainSc: 3          Complications: No apparent anesthesia complications

## 2017-12-17 DIAGNOSIS — M48062 Spinal stenosis, lumbar region with neurogenic claudication: Secondary | ICD-10-CM | POA: Diagnosis not present

## 2017-12-17 MED ORDER — TIZANIDINE HCL 4 MG PO TABS: 4.0000 mg | ORAL_TABLET | Freq: Three times a day (TID) | ORAL | 0 refills | Status: AC | PRN

## 2017-12-17 MED ORDER — HYDROCODONE-ACETAMINOPHEN 5-325 MG PO TABS: 1.0000 | ORAL_TABLET | ORAL | 0 refills | Status: AC | PRN

## 2017-12-17 NOTE — Progress Notes (Signed)
Physical Therapy Treatment Patient Details Name: Gene AbrahamWilliam Lopez MRN: [id number] DOB: 02-11-1941 Today's Date: 12/17/2017    History of Present Illness Pt s/p L3-4, L 4-5 Decompressive lumbar laminectomy    PT Comments    Pt progressing well with mobility and with good awareness of back precautions.  Pt eager for dc home.  Follow Up Recommendations  No PT follow up     Equipment Recommendations  None recommended by PT    Recommendations for Other Services OT consult     Precautions / Restrictions Precautions Precautions: Back Precaution Booklet Issued: Yes (comment) Precaution Comments: Pt recalls all back precautions without cues Restrictions Weight Bearing Restrictions: No    Mobility  Bed Mobility Overal bed mobility: Needs Assistance Bed Mobility: Supine to Sit;Sit to Supine     Supine to sit: Min guard Sit to supine: Min guard   General bed mobility comments: cues for correct log roll technique and adherence to back precautions  Transfers Overall transfer level: Needs assistance Equipment used: None Transfers: Sit to/from Stand Sit to Stand: Supervision Stand pivot transfers: Supervision       General transfer comment: cues for transition position, use of UEs to self assist and adherence to back precautions  Ambulation/Gait Ambulation/Gait assistance: Min guard;Supervision Ambulation Distance (Feet): 400 Feet Assistive device: Rolling walker (2 wheeled);None Gait Pattern/deviations: Step-through pattern;Decreased step length - right;Decreased step length - left;Shuffle;Trunk flexed Gait velocity: decr Gait velocity interpretation: Below normal speed for age/gender General Gait Details: 200' with RW and 200' sans AD.  Min cues for posture and position from Rohm and HaasW   Stairs Stairs: Yes   Stair Management: One rail Right;Forwards;Step to pattern Number of Stairs: 3 General stair comments: min cues for sequence  Wheelchair Mobility    Modified  Rankin (Stroke Patients Only)       Balance Overall balance assessment: No apparent balance deficits (not formally assessed)                                          Cognition Arousal/Alertness: Awake/alert Behavior During Therapy: WFL for tasks assessed/performed Overall Cognitive Status: Within Functional Limits for tasks assessed                                        Exercises      General Comments        Pertinent Vitals/Pain Pain Assessment: 0-10 Pain Score: 3  Pain Location: back Pain Descriptors / Indicators: Sore Pain Intervention(s): Limited activity within patient's tolerance;Monitored during session;Premedicated before session    Home Living Family/patient expects to be discharged to:: Private residence Living Arrangements: Spouse/significant other Available Help at Discharge: Family Type of Home: House Home Access: Stairs to enter Entrance Stairs-Rails: Right Home Layout: One level Home Equipment: Environmental consultantWalker - 2 wheels      Prior Function Level of Independence: Independent          PT Goals (current goals can now be found in the care plan section) Acute Rehab PT Goals Patient Stated Goal: Regain IND  PT Goal Formulation: With patient Time For Goal Achievement: 12/19/17 Potential to Achieve Goals: Good Progress towards PT goals: Progressing toward goals    Frequency    7X/week      PT Plan Current plan remains appropriate    Co-evaluation  AM-PAC PT "6 Clicks" Daily Activity  Outcome Measure  Difficulty turning over in bed (including adjusting bedclothes, sheets and blankets)?: A Little Difficulty moving from lying on back to sitting on the side of the bed? : A Little Difficulty sitting down on and standing up from a chair with arms (e.g., wheelchair, bedside commode, etc,.)?: A Little Help needed moving to and from a bed to chair (including a wheelchair)?: A Little Help needed walking  in hospital room?: A Little Help needed climbing 3-5 steps with a railing? : A Little 6 Click Score: 18    End of Session   Activity Tolerance: Patient tolerated treatment well Patient left: in chair;with call bell/phone within reach;with family/visitor present Nurse Communication: Mobility status PT Visit Diagnosis: Difficulty in walking, not elsewhere classified (R26.2)     Time: 1610-9604 PT Time Calculation (min) (ACUTE ONLY): 22 min  Charges:  $Gait Training: 8-22 mins                    G Codes:       Pg [phone]    Reita Shindler 12/17/2017, 2:00 PM

## 2017-12-17 NOTE — Discharge Instructions (Addendum)
For the first three days, remove your dressing, and tape a piece of saran wrap over your incision Take your shower, then remove the saran wrap and put a clean dressing on After three days you can shower without the saran wrap.  No lifting or excessive bending No driving while taking pain medications Resume Eliquis as instructed by Dr. Darrelyn HillockGioffre Call Dr. Darrelyn HillockGioffre if any wound complications or temperature of 101 degrees F or over.  Call the office for an appointment to see Dr. Darrelyn HillockGioffre in two weeks: [phone] and ask for Dr. Jeannetta EllisGioffre's nurse, Mackey Birchwoodammy Johnson.

## 2017-12-17 NOTE — Progress Notes (Signed)
Occupational Therapy Evaluation Patient Details Name: Gene AbrahamWilliam Calvo MRN: [id number] DOB: 12/25/1940 Today's Date: 12/17/2017    History of Present Illness Pt s/p L3-4, L 4-5 Decompressive lumbar laminectomy   Clinical Impression   OT education complete regarding ADL activity and back precautions.     Follow Up Recommendations  No OT follow up    Equipment Recommendations  None recommended by OT    Recommendations for Other Services       Precautions / Restrictions Precautions Precautions: Back Restrictions Weight Bearing Restrictions: No      Mobility Bed Mobility               General bed mobility comments: pt in chair  Transfers Overall transfer level: Needs assistance   Transfers: Sit to/from Stand;Stand Pivot Transfers Sit to Stand: Supervision Stand pivot transfers: Supervision       General transfer comment: cues for transition position, use of UEs to self assist and adherence to back precautions        ADL either performed or assessed with clinical judgement   ADL Overall ADL's : Needs assistance/impaired Eating/Feeding: Independent;Sitting   Grooming: Supervision/safety;Standing   Upper Body Bathing: Set up;Sitting   Lower Body Bathing: Min guard;Sit to/from stand;Cueing for safety;Cueing for back precautions;Cueing for sequencing;Cueing for compensatory techniques   Upper Body Dressing : Set up;Sitting   Lower Body Dressing: Min guard;Sit to/from stand;Cueing for safety;Cueing for sequencing;Cueing for compensatory techniques   Toilet Transfer: Supervision/safety;Comfort height toilet   Toileting- Clothing Manipulation and Hygiene: Supervision/safety;Sit to/from stand;Cueing for safety;Cueing for sequencing;Cueing for back precautions   Tub/ Shower Transfer: Walk-in shower;Supervision/safety;Cueing for safety   Functional mobility during ADLs: Supervision/safety General ADL Comments: VC for back precautions     Vision Patient  Visual Report: No change from baseline              Pertinent Vitals/Pain Pain Score: 3  Pain Location: back Pain Descriptors / Indicators: Sore Pain Intervention(s): Limited activity within patient's tolerance     Hand Dominance     Extremity/Trunk Assessment Upper Extremity Assessment Upper Extremity Assessment: Overall WFL for tasks assessed           Communication Communication Communication: HOH   Cognition Arousal/Alertness: Awake/alert Behavior During Therapy: WFL for tasks assessed/performed Overall Cognitive Status: Within Functional Limits for tasks assessed                                                Home Living Family/patient expects to be discharged to:: Private residence Living Arrangements: Spouse/significant other Available Help at Discharge: Family Type of Home: House Home Access: Stairs to enter Secretary/administratorntrance Stairs-Number of Steps: 3 Entrance Stairs-Rails: Right Home Layout: One level               Home Equipment: Environmental consultantWalker - 2 wheels          Prior Functioning/Environment Level of Independence: Independent                          OT Goals(Current goals can be found in the care plan section) Acute Rehab OT Goals Patient Stated Goal: Regain IND  OT Goal Formulation: With patient  OT Frequency:      AM-PAC PT "6 Clicks" Daily Activity     Outcome Measure Help from another person eating meals?: None Help from  another person taking care of personal grooming?: None Help from another person toileting, which includes using toliet, bedpan, or urinal?: A Little Help from another person bathing (including washing, rinsing, drying)?: A Little Help from another person to put on and taking off regular upper body clothing?: None Help from another person to put on and taking off regular lower body clothing?: A Little 6 Click Score: 21   End of Session Nurse Communication: Mobility status  Activity Tolerance: Patient  tolerated treatment well Patient left: in chair                   Time: 9147-8295 OT Time Calculation (min): 14 min Charges:  OT General Charges $OT Visit: 1 Visit OT Evaluation $OT Eval Low Complexity: 1 Low G-Codes:     Lise Auer, OT [phone]  Einar Crow D 12/17/2017, 10:14 AM

## 2017-12-17 NOTE — Progress Notes (Signed)
Subjective: 1 Day Post-Op Procedure(s) (LRB): Central decompression lumbar laminectomy L3-L4, L4-L5, microdisectomy (N/A) Patient reports pain as 2 on 0-10 scale. Back pain only. No further leg pain.His foot drop is now absent.   Objective: Vital signs in last 24 hours: Temp:  [97.4 F (36.3 C)-98.5 F (36.9 C)] 98.1 F (36.7 C) (01/10 0631) Pulse Rate:  [65-79] 79 (01/10 0631) Resp:  [12-18] 16 (01/10 0631) BP: (94-140)/(64-84) 117/74 (01/10 0631) SpO2:  [94 %-99 %] 96 % (01/10 0631) Weight:  [98.4 kg (217 lb)] 98.4 kg (217 lb) (01/09 1427)  Intake/Output from previous day: 01/09 0701 - 01/10 0700 In: 4215 [P.O.:360; I.V.:3700; IV Piggyback:155] Out: 1555 [Urine:1255; Blood:300] Intake/Output this shift: No intake/output data recorded.  Recent Labs    12/16/17 1252  HGB 10.5*   Recent Labs    12/16/17 1252  HCT 29.9*   No results for input(s): NA, K, CL, CO2, BUN, CREATININE, GLUCOSE, CALCIUM in the last 72 hours. No results for input(s): LABPT, INR in the last 72 hours.  Dorsiflexion/Plantar flexion intact  Assessment/Plan: 1 Day Post-Op Procedure(s) (LRB): Central decompression lumbar laminectomy L3-L4, L4-L5, microdisectomy (N/A) Up with therapy.Will DC after PT  Ranee GosselinRonald Abbigael Detlefsen 12/17/2017, 7:36 AM

## 2017-12-18 NOTE — Op Note (Signed)
NAME:  Gene Lopez, Gene Lopez NO.:  [id number]  MEDICAL RECORD NO.:  [id number]  LOCATION:                                 FACILITY:  PHYSICIAN:  Gene Lopez, M.D.DATE OF BIRTH:  January 27, 1941  DATE OF PROCEDURE:  12/16/2017 DATE OF DISCHARGE:                              OPERATIVE REPORT   SURGEON:  Gene Lynch. Darrelyn Hillock, M.D.  ASSISTANT:  Dimitri Ped, [place]  PREOPERATIVE DIAGNOSES: 1. Severe spinal stenosis with a complete block at L3-4. 2. Severe spinal stenosis with just about a complete block at L4-5. 3. Foraminal stenosis involving the L4 root on the right. 4. Foraminal stenosis involving the L5 root on the right. 5. Herniated lumbar disk, foraminal and extraforaminal at L3-4 on the     right. 6. Partial footdrop on the right.  POSTOPERATIVE DIAGNOSES: 1. Severe spinal stenosis with a complete block at L3-4. 2. Severe spinal stenosis with just about a complete block at L4-5. 3. Foraminal stenosis involving the L4 root on the right. 4. Foraminal stenosis involving the L5 root on the right. 5. Herniated lumbar disk, foraminal and extraforaminal at L3-4 on the     right. 6. Partial footdrop on the right.  OPERATIONS: 1. Complete decompressive lumbar laminectomy at L3-4. 2. Facetectomy of the superior facet at L3-4 on the right. 3. Complete decompressive lumbar laminectomy at L4-5 for spinal     stenosis.  Other two levels were decompressed, L3-4 and L4-5. 4. We did a microdiskectomy at L3-4 on the right and also as I     mentioned foraminotomies for the L4 and the L5 root on the right.  DESCRIPTION OF PROCEDURE:  Under general anesthesia, a routine orthopedic prep and draping was carried out with the patient on a spinal frame.  We first inserted a Foley.  We did appropriate time-out.  I also marked the appropriate right side of his back; even though, we were going central, marked the right side because all the symptoms were basically on the right at the  end, but during the earlier on a few weeks back, it was on the left as well.  Two needles were placed in the back for localization purposes.  After the time-out, an x-ray was ordered. Incision then was made over the L3-4 and extended down the L4-5 interspace.  At this time, self-retaining retractors were inserted.  I stripped the muscle from the lamina and spinous processes bilaterally. I then placed a Kocher clamp on these spinous processes and we identified L3 and from there down, we did a complete decompressive lumbar laminectomy at L3-4 and L4-5.  Microscope was brought in.  After the laminae were removed, I utilized the hockey-stick.  I kept the dissection up proximally and distally until we were totally free from the stenosis.  Once we went down on to the ligamentum flavum, it was extremely thick and adhered to the dura.  It took a great deal of time to gently free it up from the dura without tear on the dura.  Finally, after we did this, we then traced the ligamentum flavum out into the foramen for the L4 root and the L5 root.  We did nice foraminotomies  as well.  After this, we went up to the L3-4 space.  The needles were placed in the disk, make sure we were in the disk and we were at L3-4. Cruciate incision was made.  I then utilized the nerve hook as well as the Epstein curettes to compress the disk down into the space as far as the foraminal component as well as medially.  I then went down with the mini pituitary rongeur and completed diskectomy, specimen was sent. Once this was done, I then went out further to decompress the foramina. The roots now were freely movable, the dura was freely movable.  Once again, this was an extreme amount of thick ligamentum flavum.  He had a prior injection in that area with cortisone.  We took a great deal of time going down through the incision because of the microbleeding that was going on.  He was on Eliquis, but we had him off Eliquis for  4 days, but we still had to take a great deal of time.  Once we were down to the spinal canal, there were no major issues.  We thoroughly irrigated out the area, loosely applied some thrombin-soaked Gelfoam.  I closed the wounds in layers in usual fashion in the left.  Except the left, a small distal deep and proximal part of the wound open for drainage purposes. We injected at the beginning 20 mL of 0.5% Marcaine with epinephrine. At the end of the case, 20 mL of Exparel into the soft tissue. Initially, we injected the Marcaine to control bleeding.  The wound then was closed as I mentioned in usual fashion.  Skin was closed with metal staples.  Sterile dressing was applied.  He had 2 g of IV Ancef right at the time of surgery.    ______________________________ Gene Lynchonald A. Zeriah Baysinger, M.D.   ______________________________ Gene Lynchonald A. Darrelyn HillockGioffre, M.D.    RAG/MEDQ  D:  12/16/2017  T:  12/16/2017  Job:  [id number]

## 2017-12-18 NOTE — Discharge Summary (Signed)
Physician Discharge Summary   Patient ID: Gene Lopez MRN: [id number] DOB/AGE: 75/22/1942 75 y.o.  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 Arthritis    Hands, Knees  . Dysrhythmia   . GERD (gastroesophageal reflux disease)   . History of hiatal hernia    2008  . History of kidney stones   . Hypertension   . Pneumonia    Discharge Diagnoses:   Active Problems:   Spinal stenosis of lumbar region with neurogenic claudication  Estimated body mass index is 31.14 kg/m as calculated from the following:   Height as of this encounter: _0  (1.778 m).   Weight as of this encounter: 98.4 kg (217 lb).  Procedure:  Procedure(s) (LRB): Central decompression lumbar laminectomy L3-L4, L4-L5, microdisectomy (N/A)   Consults: None  HPI: The patient is a 77 year old male who presented with the chief complaint of low back pain x several weeks. He did not recall any specific injury. He then began to develop progressively worsening pain in the right thigh. He did not see relief of his symptoms with any conservative measures, including prednisone, NSAIDs, activity modification, and injection. CT myelogram showed a disc herniation at L3-L4 and spinal stenosis at L4-L5 on the right which is causing a foot drop on the right side.     Laboratory Data: Admission on 12/16/2017, Discharged on 12/17/2017  Component Date Value Ref Range Status  . Hemoglobin 12/16/2017 10.5* 13.0 - 17.0 g/dL Final  . HCT 12/16/2017 29.9* 39.0 - 52.0 % Final  Hospital Outpatient Visit on 12/04/2017  Component Date Value Ref Range Status  . aPTT 12/04/2017 41* 24 - 36 seconds Final   Comment:        IF BASELINE aPTT IS ELEVATED, SUGGEST PATIENT RISK ASSESSMENT BE USED TO DETERMINE APPROPRIATE ANTICOAGULANT THERAPY.   . WBC 12/04/2017 6.7  4.0 - 10.5 K/uL Final    . RBC 12/04/2017 3.59* 4.22 - 5.81 MIL/uL Final  . Hemoglobin 12/04/2017 12.7* 13.0 - 17.0 g/dL Final  . HCT 12/04/2017 36.7* 39.0 - 52.0 % Final  . MCV 12/04/2017 102.2* 78.0 - 100.0 fL Final  . MCH 12/04/2017 35.4* 26.0 - 34.0 pg Final  . MCHC 12/04/2017 34.6  30.0 - 36.0 g/dL Final  . RDW 12/04/2017 12.7  11.5 - 15.5 % Final  . Platelets 12/04/2017 126* 150 - 400 K/uL Final  . Neutrophils Relative % 12/04/2017 71  % Final  . Neutro Abs 12/04/2017 4.8  1.7 - 7.7 K/uL Final  . Lymphocytes Relative 12/04/2017 18  % Final  . Lymphs Abs 12/04/2017 1.2  0.7 - 4.0 K/uL Final  . Monocytes Relative 12/04/2017 9  % Final  . Monocytes Absolute 12/04/2017 0.6  0.1 - 1.0 K/uL Final  . Eosinophils Relative 12/04/2017 2  % Final  . Eosinophils Absolute 12/04/2017 0.1  0.0 - 0.7 K/uL Final  . Basophils Relative 12/04/2017 0  % Final  . Basophils Absolute 12/04/2017 0.0  0.0 - 0.1 K/uL Final  . Sodium 12/04/2017 139  135 - 145 mmol/L Final  . Potassium 12/04/2017 4.8  3.5 - 5.1 mmol/L Final  . Chloride 12/04/2017 111  101 - 111 mmol/L Final  . CO2 12/04/2017 24  22 - 32 mmol/L Final  . Glucose, Bld 12/04/2017 92  65 - 99 mg/dL Final  . BUN 12/04/2017 26* 6 - 20 mg/dL  Final  . Creatinine, Ser 12/04/2017 1.18  0.61 - 1.24 mg/dL Final  . Calcium 12/04/2017 8.8* 8.9 - 10.3 mg/dL Final  . Total Protein 12/04/2017 7.0  6.5 - 8.1 g/dL Final  . Albumin 12/04/2017 3.9  3.5 - 5.0 g/dL Final  . AST 12/04/2017 19  15 - 41 U/L Final  . ALT 12/04/2017 25  17 - 63 U/L Final  . Alkaline Phosphatase 12/04/2017 69  38 - 126 U/L Final  . Total Bilirubin 12/04/2017 1.1  0.3 - 1.2 mg/dL Final  . GFR calc non Af Amer 12/04/2017 59* >60 mL/min Final  . GFR calc Af Amer 12/04/2017 >60  >60 mL/min Final   Comment: (NOTE) The eGFR has been calculated using the CKD EPI equation. This calculation has not been validated in all clinical situations. eGFR's persistently <60 mL/min signify possible Chronic  Kidney Disease.   . Anion gap 12/04/2017 4* 5 - 15 Final  . Prothrombin Time 12/04/2017 16.2* 11.4 - 15.2 seconds Final  . INR 12/04/2017 1.31   Final  . MRSA, PCR 12/04/2017 NEGATIVE  NEGATIVE Final  . Staphylococcus aureus 12/04/2017 NEGATIVE  NEGATIVE Final   Comment: (NOTE) The Xpert SA Assay (FDA approved for NASAL specimens in patients 92 years of age and older), is one component of a comprehensive surveillance program. It is not intended to diagnose infection nor to guide or monitor treatment.      X-Rays:Dg Chest 2 View  Result Date: 12/04/2017 CLINICAL DATA:  History of atrial fibrillation. EXAM: CHEST  2 VIEW COMPARISON:  No prior. FINDINGS: Mediastinum and hilar structures are normal. Heart size normal. Low lung volumes with mild basilar atelectasis. No pleural effusion or pneumothorax. Prior cervicothoracic spine fusion. IMPRESSION: 1. Low lung volumes with mild basilar atelectasis. 2. Cardiomegaly with normal pulmonary vascularity. Electronically Signed   By: Marcello Moores  Register   On: 12/04/2017 16:31   Dg Lumbar Spine 2-3 Views  Result Date: 12/04/2017 CLINICAL DATA:  Preop lumbar spine surgery. EXAM: LUMBAR SPINE - 2-3 VIEW COMPARISON:  11/10/2017  Preop lumbar spine surgery FINDINGS: Lumbar vertebra have been labeled using the same numbering scheme as CT from 11/10/2017. Mild thoraco lumbar scoliosis deformity noted. Multi level disc space narrowing and ventral spurring noted within the lumbar spine. IMPRESSION: 1. Scoliosis and degenerative disc disease. Electronically Signed   By: Kerby Moors M.D.   On: 12/04/2017 16:36   Dg Spine Portable 1 View  Result Date: 12/16/2017 CLINICAL DATA:  Disc herniation at L3-4.  Spinal stenosis at L4-5. EXAM: PORTABLE SPINE - 1 VIEW COMPARISON:  Lumbar radiographs dated 12/04/2017 FINDINGS: The image performed at 11:28 a.m. on 12/16/2017 demonstrates instruments at the L3-4 level. IMPRESSION: Instruments at L3-4. Electronically Signed    By: Lorriane Shire M.D.   On: 12/16/2017 11:53   Dg Spine Portable 1 View  Result Date: 12/16/2017 CLINICAL DATA:  L3-4 and L4-5 decompression EXAM: PORTABLE SPINE - 1 VIEW COMPARISON:  Studies obtained earlier in the day FINDINGS: Cross-table lateral lumbar image time stamped 10:54:52 submitted. Metallic probe tip is posterior to the L4-5 interspace level. There is slight anterolisthesis of L4 on L5. No other spondylolisthesis. No fracture. There is disc space narrowing at L2-3, L3-4, L4-5, and L5-S1. There is aortic atherosclerosis. IMPRESSION: Metallic probe tip posterior to the L4-5 interspace level. Mild spondylolisthesis noted at L4-5. There is multilevel arthropathy. There is aortic atherosclerosis. Aortic Atherosclerosis (ICD10-I70.0). Electronically Signed   By: Lowella Grip III M.D.   On: 12/16/2017 11:12  Dg Spine Portable 1 View  Result Date: 12/16/2017 CLINICAL DATA:  L3-4 and L4-5 lumbar decompression. EXAM: PORTABLE SPINE - 1 VIEW COMPARISON:  Intraoperative lumbar spine radiograph from earlier today. FINDINGS: Posterior approach surgical marking device terminates over upper L4 lamina. Surgical retractors are noted posterior to the lower lumbar spine. Moderate multilevel lumbar degenerative disc disease. IMPRESSION: Posterior approach surgical marking device terminates over the upper L4 lamina. Electronically Signed   By: Ilona Sorrel M.D.   On: 12/16/2017 10:58   Dg Spine Portable 1 View  Result Date: 12/16/2017 CLINICAL DATA:  Intraoperative film #2. The patient is undergoing L3-4 and L4-5 lumbar decompression. EXAM: PORTABLE SPINE - 1 VIEW COMPARISON:  Lateral view of the lumbar spine of earlier today. AP and lateral views of the lumbar spine of December 04, 2017 FINDINGS: The upper surgical instrument projects over the posterior aspect of the L2 spinous process. The lower surgical instrument projects over the posterior aspect of the L3 spinous process. IMPRESSION: Positioning of the  surgical instruments over the spinous processes of L2 and L3 as described. Electronically Signed   By: David  Martinique M.D.   On: 12/16/2017 10:07   Dg Spine Portable 1 View  Result Date: 12/16/2017 CLINICAL DATA:  Intraoperative localization EXAM: PORTABLE SPINE - 1 VIEW COMPARISON:  12/04/2017 FINDINGS: Posterior needles are it within the L2-3 and L3-4 interspaces posteriorly. IMPRESSION: Intraoperative localization as above. Electronically Signed   By: Rolm Baptise M.D.   On: 12/16/2017 09:43    EKG:No orders found for this or any previous visit.   Hospital Course: Ja Ohman is a 77 y.o. who was admitted to [facility]. They were brought to the operating room on 12/16/2017 and underwent Procedure(s): Central decompression lumbar laminectomy L3-L4, L4-L5, microdisectomy.  Patient tolerated the procedure well and was later transferred to the recovery room and then to the orthopaedic floor for postoperative care.  They were given PO and IV analgesics for pain control following their surgery.  They were given 24 hours of postoperative antibiotics of  Anti-infectives (From admission, onward)   Start     Dose/Rate Route Frequency Ordered Stop   12/16/17 1800  ceFAZolin (ANCEF) IVPB 1 g/50 mL premix  Status:  Discontinued     1 g 100 mL/hr over 30 Minutes Intravenous Every 8 hours 12/16/17 1443 12/17/17 1322   12/16/17 0610  ceFAZolin (ANCEF) IVPB 2g/100 mL premix     2 g 200 mL/hr over 30 Minutes Intravenous On call to O.R. 12/16/17 6160 12/16/17 0908   12/16/17 0000  polymyxin B 500,000 Units, bacitracin 50,000 Units in sodium chloride 0.9 % 500 mL irrigation  Status:  Discontinued       As needed 12/16/17 0954 12/16/17 1231    PT had the patient up walking without difficulty.  Discharge planning consulted to help with postop disposition and equipment needs.  Patient had a good night on the evening of surgery.  They started to get up OOB with therapy on day one.  Dressing was changed  and the incision was dry and clean.  Patient was seen in rounds and was ready to go home.   Diet: Cardiac diet Activity:WBAT Follow-up:in 2 weeks Disposition - Home Discharged Condition: stable   Discharge Instructions    Call MD / Call 911   Complete by:  As directed    If you experience chest pain or shortness of breath, CALL 911 and be transported to the hospital emergency room.  If you develope  a fever above 101 F, pus (white drainage) or increased drainage or redness at the wound, or calf pain, call your surgeon's office.   Constipation Prevention   Complete by:  As directed    Drink plenty of fluids.  Prune juice may be helpful.  You may use a stool softener, such as Colace (over the counter) 100 mg twice a day.  Use MiraLax (over the counter) for constipation as needed.   Diet - low sodium heart healthy   Complete by:  As directed    Discharge instructions   Complete by:  As directed    For the first three days, remove your dressing, and tape a piece of saran wrap over your incision Take your shower, then remove the saran wrap and put a clean dressing on After three days you can shower without the saran wrap.  No lifting or excessive bending No driving while taking pain medications Resume Eliquis as instructed by Dr. Gladstone Lighter Call Dr. Gladstone Lighter if any wound complications or temperature of 101 degrees F or over.  Call the office for an appointment to see Dr. Gladstone Lighter in two weeks: [phone] and ask for Dr. Charlestine Night nurse, Brunilda Payor.   Increase activity slowly as tolerated   Complete by:  As directed      Allergies as of 12/17/2017      Reactions   Other    Pet dander   Versed [midazolam] Other (See Comments)   Causes "severe memory loss for days"  Refuses versed      Medication List    STOP taking these medications   acetaminophen 650 MG CR tablet Commonly known as:  TYLENOL   baclofen 20 MG tablet Commonly known as:  LIORESAL     TAKE these medications    apixaban 5 MG Tabs tablet Commonly known as:  ELIQUIS Take 5 mg 2 (two) times daily by mouth.   atorvastatin 10 MG tablet Commonly known as:  LIPITOR Take 10 mg by mouth at bedtime.   cetirizine 10 MG tablet Commonly known as:  ZYRTEC Take 10 mg daily by mouth.   CIALIS 20 MG tablet Generic drug:  tadalafil Take 20 mg by mouth daily as needed for erectile dysfunction.   dofetilide 500 MCG capsule Commonly known as:  TIKOSYN Take 500 mcg by mouth every 12 (twelve) hours.   DYMISTA 137-50 MCG/ACT Susp Generic drug:  Azelastine-Fluticasone Place 1-2 sprays into the nose daily as needed (for nasal drainage.).   HYDROcodone-acetaminophen 5-325 MG tablet Commonly known as:  NORCO/VICODIN Take 1-2 tablets by mouth every 4 (four) hours as needed for moderate pain ((score 4 to 6)).   lisinopril 10 MG tablet Commonly known as:  PRINIVIL,ZESTRIL Take 10 mg by mouth daily.   metoprolol succinate 25 MG 24 hr tablet Commonly known as:  TOPROL-XL Take 12.5 mg by mouth daily.   montelukast 10 MG tablet Commonly known as:  SINGULAIR Take 10 mg by mouth daily.   tiZANidine 4 MG tablet Commonly known as:  ZANAFLEX Take 1 tablet (4 mg total) by mouth every 8 (eight) hours as needed for muscle spasms. What changed:    how much to take  when to take this   vitamin C 1000 MG tablet Take 1,000 mg by mouth daily.      Follow-up Information    Latanya Maudlin, MD. Schedule an appointment as soon as possible for a visit in 2 week(s).   Specialty:  Orthopedic Surgery Contact information: [address]  [place] [address] [phone]           Signed: Ardeen Jourdain, PA-C Orthopaedic Surgery 12/18/2017, 8:54 AM

## 2019-10-10 IMAGING — CT CT L SPINE W/ CM
1 of 7 series · 6 of 14 positions shown, 8 images · non-contrast
Comparison: None

CLINICAL DATA: Spondylosis without myelopathy. Low back pain and
right leg pain and numbness. Pain radiates to the right groin, thigh
and proximal lower leg. No left-sided symptoms presently.
TECHNIQUE: Contiguous axial images were obtained through the Lumbar spine after
the intrathecal infusion of infusion. Coronal and sagittal
reconstructions were obtained of the axial image sets.

[Series 3: l spine soft · axial · 0.30mm/px · z∈[-431,-251]mm · 6 of 84 slices shown, 8 images]
[im 12/84  soft-tissue]
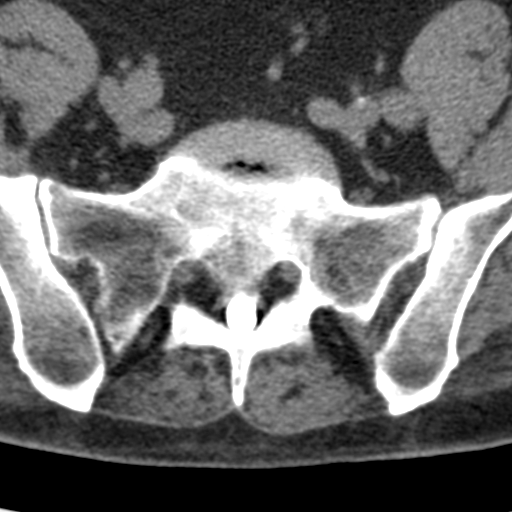
[im 12/84  bone]
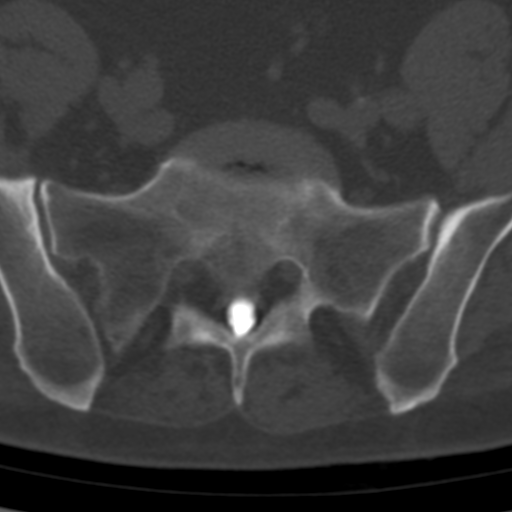
[im 24/84  bone]
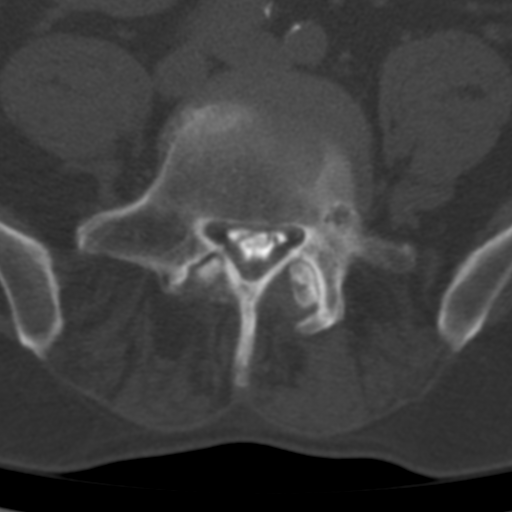
[im 36/84  bone]
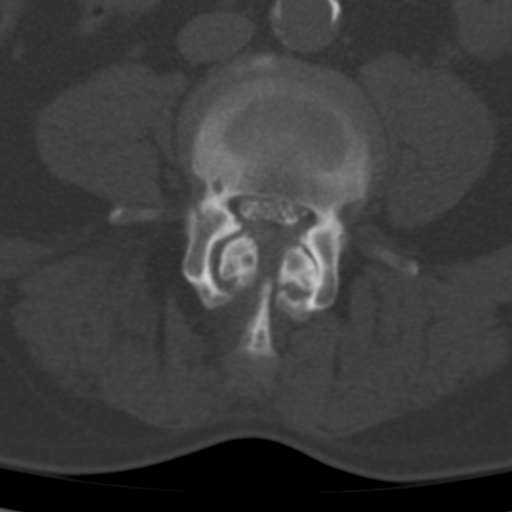
[im 48/84  bone]
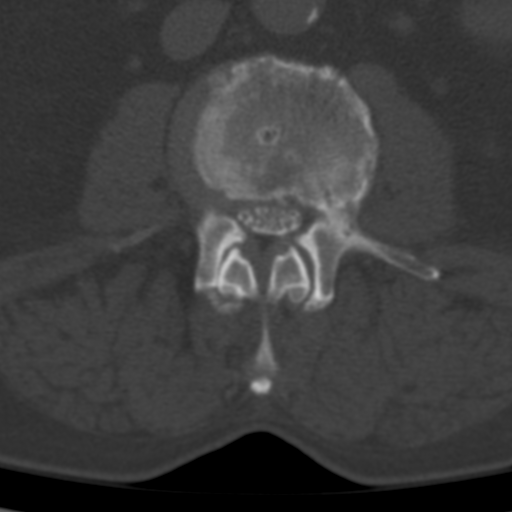
[im 60/84  soft-tissue]
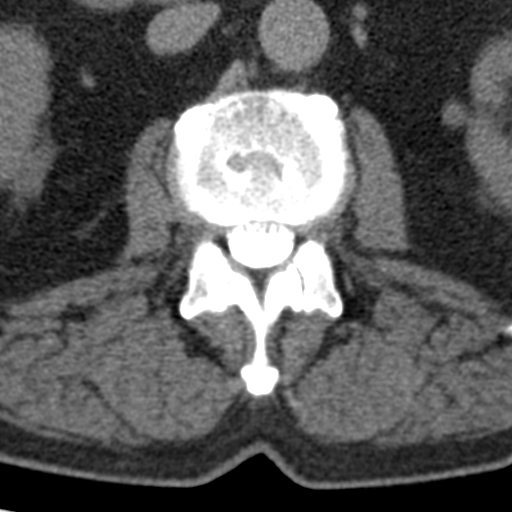
[im 60/84  bone]
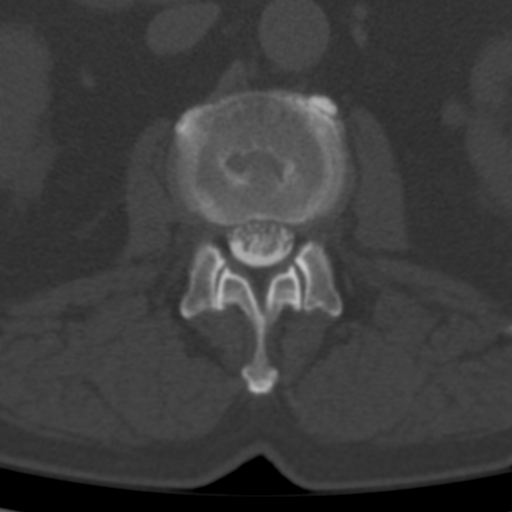
[im 72/84  bone]
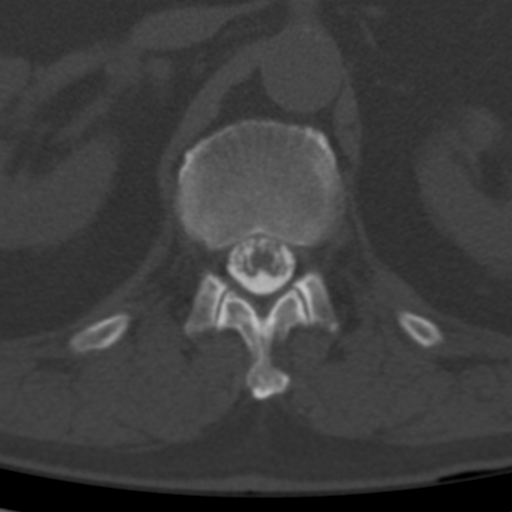

[6 of 14 positions shown; findings below may reference images not displayed]

EXAM:
LUMBAR MYELOGRAM

FLUOROSCOPY TIME:  0 minutes 45 seconds. 205.44 micro gray meter
squared

PROCEDURE:
After thorough discussion of risks and benefits of the procedure
including bleeding, infection, injury to nerves, blood vessels,
adjacent structures as well as headache and CSF leak, written and
oral informed consent was obtained. Consent was obtained by Dr. Schexnayder
Nodi. Time out form was completed.

Patient was positioned prone on the fluoroscopy table. Local
anesthesia was provided with 1% lidocaine without epinephrine after
prepped and draped in the usual sterile fashion. Puncture was
performed at L5-S1 using a 3 1/2 inch 22-gauge spinal needle via
left para median approach. Using a single pass through the dura, the
needle was placed within the thecal sac, with return of clear CSF.
15 mL of Isovue [id number] was injected into the thecal sac, with normal
opacification of the nerve roots and cauda equina consistent with
free flow within the subarachnoid space.

I personally performed the lumbar puncture and administered the
intrathecal contrast. I also personally performed acquisition of the
myelogram images.
FINDINGS: LUMBAR MYELOGRAM FINDINGS:

L2-3: Lateral recess stenosis right worse than left.

L3-4:  Lateral recess stenosis left worse than right.

L4-5:  Lateral recess stenosis right worse than left.

L5-S1:  No apparent stenosis or neural compression.

Anterolisthesis at L4-5 of 6 mm in the prone position. With standing
and flexion, there is anterolisthesis at L3-4 of 4 mm.
Anterolisthesis at L4-5 increases to 8 mm.

CT LUMBAR MYELOGRAM FINDINGS:

T12-L1 and L1-2:  Normal.

L2-3: Rightward translation of L2 relative to L3 of 9 mm. Disc
degeneration of vacuum phenomenon. Mild bulging of the disc. Mild
facet hypertrophy. Mild lateral recess stenosis right more than
left. No definite neural compression.

L3-4: Right foraminal to extraforaminal disc herniation likely to
compress the right L3 nerve. Circumferential bulging of the disc
elsewhere. Facet arthropathy with facet and ligamentous hypertrophy.
Probable 7 mm synovial cyst projecting inward from the facet on the
left. Multifactorial stenosis at this level which could be
significant and likely worsens with standing based on the
radiographs.

L4-5: Anterolisthesis of 6 mm. Protrusion of the disc more prominent
in the right posterolateral direction. Bilateral facet arthropathy
and hypertrophy. Multifactorial spinal stenosis most severe on the
right. Neural compression is likely at this level, particularly on
the right. This appearance would worsen with standing or flexion.

L5-S1: Disc degeneration of vacuum phenomenon. Mild bulging of the
disc. Mild facet osteoarthritis. No compressive stenosis.

Mild sacroiliac osteoarthritis.
IMPRESSION: L2-3: Right Marlene Andrea translation of L2 relative to L3 of 9 mm. Bulging
of the disc. Lateral recess narrowing right more than left without
definite neural compression.

L3-4: Right foraminal to extraforaminal disc herniation with likely
right L3 nerve compression. Circumferential bulging of the disc.
Facet arthropathy with probable synovial cyst on the left.
Multifactorial stenosis which is probably significant, and
additionally worsens with standing or flexion based on the
radiography.

L4-5: Facet arthropathy with 6 mm of anterolisthesis, increasing to
8 mm with standing and flexion. Disc protrusion more prominent
towards the right. Severe stenosis most pronounced on the right.
Neural compression likely at this level, particularly on the right.

L5-S1: Mild disc degeneration facet arthritis but without stenosis
or visible neural compression.

## 2021-05-13 ENCOUNTER — Emergency Department
Admission: EM | Admit: 2021-05-13 | Discharge: 2021-06-07 | Disposition: E | Payer: Medicare HMO | Attending: Emergency Medicine | Admitting: Emergency Medicine

## 2021-05-13 DIAGNOSIS — I10 Essential (primary) hypertension: Secondary | ICD-10-CM | POA: Diagnosis not present

## 2021-05-13 DIAGNOSIS — Z96653 Presence of artificial knee joint, bilateral: Secondary | ICD-10-CM | POA: Diagnosis not present

## 2021-05-13 DIAGNOSIS — Z87891 Personal history of nicotine dependence: Secondary | ICD-10-CM | POA: Insufficient documentation

## 2021-05-13 DIAGNOSIS — Z7901 Long term (current) use of anticoagulants: Secondary | ICD-10-CM | POA: Insufficient documentation

## 2021-05-13 DIAGNOSIS — I469 Cardiac arrest, cause unspecified: Secondary | ICD-10-CM

## 2021-05-13 DIAGNOSIS — Z79899 Other long term (current) drug therapy: Secondary | ICD-10-CM | POA: Insufficient documentation

## 2021-05-13 LAB — CBG MONITORING, ED: Glucose-Capillary: 97 mg/dL (ref 70–99)

## 2021-05-13 MED ORDER — ATROPINE SULFATE 1 MG/ML IJ SOLN
INTRAMUSCULAR | Status: AC | PRN
  Administered 2021-05-13: 1 mg via INTRAVENOUS

## 2021-05-13 MED ORDER — EPINEPHRINE 1 MG/10ML IJ SOSY
PREFILLED_SYRINGE | INTRAMUSCULAR | Status: AC | PRN
  Administered 2021-05-13 (×3): 1 mg via INTRAVENOUS

## 2021-05-14 MED FILL — Medication: Qty: 1 | Status: AC

## 2021-06-07 NOTE — ED Notes (Signed)
Attempted to contact wife, no answer.

## 2021-06-07 NOTE — Code Documentation (Signed)
Pulse check: no pulses felt.

## 2021-06-07 NOTE — Code Documentation (Signed)
cbg [id number] 

## 2021-06-07 NOTE — ED Provider Notes (Addendum)
[facility] Emergency Department Provider Note  ____________________________________________   None    (approximate)  I have reviewed the triage vital signs and the nursing notes.   HISTORY  Chief Complaint Cardiac Arrest   HPI Gene Lopez is a 80 y.o. male past, history of arthritis, GERD, HTN and recent left upper extremity injury and brace who presents to EMS after a reported possible fall after a syncopal episode.  Per EMS patient was unresponsive on arrival and was bradycardic.  Initially for pacing patient in route however patient lost pulses and CPR was started.  10 minutes of CPR were performed prior to arrival.  Patient received 2 doses of epinephrine prior to arrival.  He had normal sugar in route.  He was ventilated in route lidocaine airway.  No other history is immediately available on patient arrival.  Patient is unresponsive on arrival         Past Medical History:  Diagnosis Date  . Arthritis    Hands, Knees  . Dysrhythmia   . GERD (gastroesophageal reflux disease)   . History of hiatal hernia    2008  . History of kidney stones   . Hypertension   . Pneumonia     Patient Active Problem List   Diagnosis Date Noted  . 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 COLONOSCOPY    . FRACTURE SURGERY  2003   Wrist  . KNEE ARTHROPLASTY Bilateral    L full arthroplast, R partial knee replacement  . LUMBAR LAMINECTOMY/DECOMPRESSION MICRODISCECTOMY N/A 12/16/2017   Procedure: Central decompression lumbar laminectomy L3-L4, L4-L5, microdisectomy;  Surgeon: Ranee Gosselin, MD;  Location: [facility] ORS;  Service: Orthopedics;  Laterality: N/A;  . TONSILLECTOMY      Prior to Admission medications   Medication Sig Start Date End Date Taking? Authorizing Provider  apixaban (ELIQUIS) 5 MG TABS tablet Take 5 mg 2 (two) times daily by mouth.    [provider]  Ascorbic Acid (VITAMIN C) 1000 MG tablet Take 1,000 mg by mouth daily.    [provider]  atorvastatin (LIPITOR) 10 MG tablet Take 10 mg by mouth at bedtime.     [provider]  Azelastine-Fluticasone (DYMISTA) 137-50 MCG/ACT SUSP Place 1-2 sprays into the nose daily as needed (for nasal drainage.).    [provider]  cetirizine (ZYRTEC) 10 MG tablet Take 10 mg daily by mouth.    [provider]  dofetilide (TIKOSYN) 500 MCG capsule Take 500 mcg by mouth every 12 (twelve) hours.     [provider]  HYDROcodone-acetaminophen (NORCO/VICODIN) 5-325 MG tablet Take 1-2 tablets by mouth every 4 (four) hours as needed for moderate pain ((score 4 to 6)). 12/17/17   Porterfield, Amber, PA-C  lisinopril (PRINIVIL,ZESTRIL) 10 MG tablet Take 10 mg by mouth daily.    [provider]  metoprolol succinate (TOPROL-XL) 25 MG 24 hr tablet Take 12.5 mg by mouth daily.     [provider]  montelukast (SINGULAIR) 10 MG tablet Take 10 mg by mouth daily.     [provider]  tadalafil (CIALIS) 20 MG tablet Take 20 mg by mouth daily as needed for erectile dysfunction.    [provider]  tiZANidine (ZANAFLEX) 4 MG tablet Take 1 tablet (4 mg total) by mouth every 8 (eight) hours as needed for muscle spasms. 12/17/17   Porterfield, Amber, PA-C    Allergies Other  and Versed [midazolam]  No family history on file.  Social History Social History   Tobacco Use  . Smoking status: Former Smoker    Quit date: 1984    Years since quitting: 38.4  . Smokeless tobacco: Never Used  Vaping Use  . Vaping Use: Never used  Substance Use Topics  . Alcohol use: Yes    Alcohol/week: 3.0 standard drinks    Types: 1 Glasses of wine, 1 Cans of beer, 1 Shots of liquor per week    Comment: daily  . Drug use: No    Review of Systems  Review of Systems  Unable to perform ROS: Acuity of condition       ____________________________________________   PHYSICAL EXAM:  VITAL SIGNS: ED Triage Vitals  Enc Vitals Group     BP      Pulse      Resp      Temp      Temp src      SpO2      Weight      Height      Head Circumference      Peak Flow      Pain Score      Pain Loc      Pain Edu?      Excl. in GC?    There were no vitals filed for this visit. Physical Exam Vitals and nursing note reviewed.  Constitutional:      General: He is in acute distress.     Appearance: He is ill-appearing.     Interventions: He is intubated.  HENT:     Head: Normocephalic and atraumatic.     Right Ear: External ear normal.     Left Ear: External ear normal.     Nose: Nose normal.  Eyes:     Pupils:     Right eye: Pupil is not reactive.     Left eye: Pupil is not reactive.  Cardiovascular:     Pulses:          Carotid pulses are 0 on the right side and 0 on the left side.      Femoral pulses are 0 on the right side and 0 on the left side. Pulmonary:     Effort: Respiratory distress present. He is intubated.  Abdominal:     General: There is no distension.  Musculoskeletal:        General: No deformity.  Skin:    General: Skin is dry.     Capillary Refill: Capillary refill takes more than 3 seconds.  Neurological:     Mental Status: He is unresponsive.      ____________________________________________   LABS (all labs ordered are listed, but only abnormal results are displayed)  Labs Reviewed  CBG MONITORING, ED   ____________________________________________  EKG  A. fib with a rate of 106, right bundle branch block, left anterior fascicle block multiple nonspecific changes concerning for ischemia with prolonged QTc interval. ____________________________________________  RADIOLOGY  ED MD interpretation:    Official radiology report(s): No results found.  ____________________________________________   PROCEDURES  Procedure(s) performed (including Critical  Care):  .Critical Care Performed by: Gilles Chiquito, MD Authorized by: Gilles Chiquito, MD   Critical care provider statement:    Critical care time (minutes):  20   Critical care time was exclusive of:  Separately billable procedures and treating other patients   Critical care was necessary to treat or prevent imminent or life-threatening deterioration of the  following conditions:  Cardiac failure, respiratory failure and CNS failure or compromise   Critical care was time spent personally by me on the following activities:  Discussions with consultants, evaluation of patient's response to treatment, examination of patient, ordering and performing treatments and interventions, ordering and review of laboratory studies, ordering and review of radiographic studies, pulse oximetry, re-evaluation of patient's condition, obtaining history from patient or surrogate and review of old charts Procedure Name: Intubation Date/Time: 05-25-21 2:50 PM Performed by: Gilles Chiquito, MD Pre-anesthesia Checklist: Suction available, Emergency Drugs available and Patient being monitored Oxygen Delivery Method: Non-rebreather mask Preoxygenation: Pre-oxygenation with 100% oxygen Tube size: 7.5 mm Number of attempts: 1 Airway Equipment and Method: Video-laryngoscopy Tube secured with: ETT holder        ____________________________________________   INITIAL IMPRESSION / ASSESSMENT AND PLAN / ED COURSE      Patient presented with above history and exam for assessment and treatment of bradycardic arrest after an EMS was initially called out for a syncopal episode and possible fall.  On arrival patient had PEA rhythm was pulseless and unresponsive.  Pupils nonreactive bilaterally.  There is a brace in place over his left arm but no other obvious evidence of significant trauma.  King airway immediately exchanged for ET tube.  Per procedure note above.  ACLS protocols continued for approximately 16  minutes.  Patient received several subsequent doses of epinephrine.  Fortunately he was never in a shockable rhythm.  He was ventilated throughout this time without difficulty.  Repeat blood sugar obtained at 97.  On final pulse check patient was noted to be apneic pulseless and unresponsive with fixed and dilated pupils.  It was felt that further resuscitative efforts were futile and time of death was called at 2:30 PM.  I did notify patient's Penny Pia did arrive at bedside.  Joselyn Glassman subsequently notified patient's wife and Tyler's mother patient's wife.        ____________________________________________   FINAL CLINICAL IMPRESSION(S) / ED DIAGNOSES  Final diagnoses:  Cardiac arrest (HCC)    Medications  atropine injection (1 mg Intravenous Given 2021/05/25 1416)  EPINEPHrine (ADRENALIN) 1 MG/10ML injection (1 mg Intravenous Given 05-25-2021 1423)     ED Discharge Orders    None       Note:  This document was prepared using Dragon voice recognition software and may include unintentional dictation errors.   Gilles Chiquito, MD 05-25-2021 1452    Gilles Chiquito, MD May 25, 2021 1537

## 2021-06-07 NOTE — Code Documentation (Signed)
Patient time of death occurred at 1430.

## 2021-06-07 NOTE — ED Notes (Signed)
Pt's stepson and chaplain at bedside. [place] notified of death, stepson states he has notified of death This RN notified [facility] of family being notified of patient's death

## 2021-06-07 NOTE — Code Documentation (Signed)
Pt intubated 7.5 tube 23 at lip Dr Katrinka Blazing

## 2021-06-07 NOTE — ED Notes (Signed)
[facility] to call RN back

## 2021-06-07 NOTE — Code Documentation (Signed)
No pulses felt PEA per drsmith

## 2021-06-07 NOTE — Code Documentation (Addendum)
Pt to ED [place] from home cpr. Called for fall. Loaded onto truck and paced brady on monitor went unresponsive. 22 to right hand placed ems. 2 epi given PTA Samuel Bouche on pt PTA

## 2021-06-07 NOTE — Code Documentation (Signed)
Pulse check: no pulses felt. PEA per Dr Emmit Pomfret resumed

## 2021-06-07 NOTE — ED Notes (Signed)
This RN called back [facility], talked with Luisa Hart. Will back when pt's family is notified.

## 2021-06-07 NOTE — ED Notes (Signed)
Per charge, family leaving All personal belongings given to family, besides one gray pant, one white underpant, one arm brace that family agrees to dispose All lines, drains, tubes removed due to no ME or autopsy request Patient with hospital gown placed in white body bag by Clinical research associate and Consulting civil engineer.  Tag placed on patient's right great toe and on outside zipper of white body bag Transport called for morgue

## 2021-06-07 NOTE — Progress Notes (Signed)
Chaplain Maggie accompanied patient's family members to come to his bedside after his passing. Space was made for expressing grief, storytelling, and prayer. [place] later this afternoon. Continued social, emotional, and spiritual support is available per on call Chaplain.

## 2021-06-07 DEATH — deceased
